# Patient Record
Sex: Female | Born: 1980 | Race: White | Hispanic: No | Marital: Married | Smoking: Never smoker
Health system: Southern US, Community
[De-identification: ages and names within clinical notes are randomized; demographics above are authoritative.]

## PROBLEM LIST (undated history)

## (undated) DIAGNOSIS — E079 Disorder of thyroid, unspecified: Secondary | ICD-10-CM

## (undated) DIAGNOSIS — R768 Other specified abnormal immunological findings in serum: Secondary | ICD-10-CM

## (undated) DIAGNOSIS — R51 Headache: Secondary | ICD-10-CM

## (undated) DIAGNOSIS — F419 Anxiety disorder, unspecified: Secondary | ICD-10-CM

## (undated) HISTORY — DX: Other specified abnormal immunological findings in serum: R76.8

## (undated) HISTORY — PX: WISDOM TOOTH EXTRACTION: SHX21

## (undated) HISTORY — DX: Anxiety disorder, unspecified: F41.9

## (undated) HISTORY — PX: AUGMENTATION MAMMAPLASTY: SUR837

## (undated) HISTORY — PX: OTHER SURGICAL HISTORY: SHX169

## (undated) HISTORY — DX: Disorder of thyroid, unspecified: E07.9

---
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
## 1998-12-10 ENCOUNTER — Other Ambulatory Visit: Admission: RE | Admit: 1998-12-10 | Discharge: 1998-12-10 | Payer: Self-pay | Admitting: Gynecology

## 1999-12-01 ENCOUNTER — Other Ambulatory Visit: Admission: RE | Admit: 1999-12-01 | Discharge: 1999-12-01 | Payer: Self-pay | Admitting: Gynecology

## 2000-12-16 ENCOUNTER — Other Ambulatory Visit: Admission: RE | Admit: 2000-12-16 | Discharge: 2000-12-16 | Payer: Self-pay | Admitting: Gynecology

## 2001-12-20 ENCOUNTER — Other Ambulatory Visit: Admission: RE | Admit: 2001-12-20 | Discharge: 2001-12-20 | Payer: Self-pay | Admitting: Gynecology

## 2002-12-21 ENCOUNTER — Other Ambulatory Visit: Admission: RE | Admit: 2002-12-21 | Discharge: 2002-12-21 | Payer: Self-pay | Admitting: Gynecology

## 2003-07-24 ENCOUNTER — Inpatient Hospital Stay (HOSPITAL_COMMUNITY): Admission: RE | Admit: 2003-07-24 | Discharge: 2003-07-27 | Payer: Self-pay | Admitting: Gynecology

## 2003-09-11 ENCOUNTER — Other Ambulatory Visit: Admission: RE | Admit: 2003-09-11 | Discharge: 2003-09-11 | Payer: Self-pay | Admitting: Gynecology

## 2004-09-25 ENCOUNTER — Other Ambulatory Visit: Admission: RE | Admit: 2004-09-25 | Discharge: 2004-09-25 | Payer: Self-pay | Admitting: Gynecology

## 2005-01-09 HISTORY — PX: OTHER SURGICAL HISTORY: SHX169

## 2005-08-20 ENCOUNTER — Other Ambulatory Visit: Admission: RE | Admit: 2005-08-20 | Discharge: 2005-08-20 | Payer: Self-pay | Admitting: Gynecology

## 2006-03-12 ENCOUNTER — Inpatient Hospital Stay (HOSPITAL_COMMUNITY): Admission: RE | Admit: 2006-03-12 | Discharge: 2006-03-14 | Payer: Self-pay | Admitting: Gynecology

## 2006-04-26 ENCOUNTER — Other Ambulatory Visit: Admission: RE | Admit: 2006-04-26 | Discharge: 2006-04-26 | Payer: Self-pay | Admitting: Gynecology

## 2006-10-10 HISTORY — PX: BREAST SURGERY: SHX581

## 2006-10-10 HISTORY — PX: AUGMENTATION MAMMAPLASTY: SUR837

## 2009-05-11 HISTORY — PX: LASIK: SHX215

## 2010-08-27 ENCOUNTER — Ambulatory Visit: Payer: 59 | Admitting: Obstetrics & Gynecology

## 2010-08-27 DIAGNOSIS — Z30433 Encounter for removal and reinsertion of intrauterine contraceptive device: Secondary | ICD-10-CM

## 2010-08-29 ENCOUNTER — Other Ambulatory Visit: Payer: Self-pay | Admitting: Family

## 2010-08-29 ENCOUNTER — Ambulatory Visit: Payer: 59

## 2010-08-29 DIAGNOSIS — Z01419 Encounter for gynecological examination (general) (routine) without abnormal findings: Secondary | ICD-10-CM

## 2010-08-29 DIAGNOSIS — Z1272 Encounter for screening for malignant neoplasm of vagina: Secondary | ICD-10-CM

## 2010-08-30 NOTE — Assessment & Plan Note (Signed)
Holly Washington, Holly Washington            ACCOUNT NO.:  [id number]  MEDICAL RECORD NO.:  [id number]           PATIENT TYPE:  O  LOCATION:  [facility] [place]          [facility]ACILITY:  [facility]  PHYSICIAN:  Sid Falcon, CNM  DATE OF BIRTH:  August 08, 1980  DATE OF SERVICE:  08/29/2010                                 CLINIC NOTE  REASON FOR VISIT:  Well-woman exam and Pap smear.  The patient is here with reports of last Pap smear in 2010.  MENSTRUAL HISTORY:  Menarche at 30 years of age.  Reports a history of regular cycles.  Currently, has an IUD in place, but does not recall her last menstrual cycle.  OBSTETRICAL HISTORY:  History of 2 pregnancies with 2 living children, both via C-section x2.  GYNECOLOGIC HISTORY:  Last Pap smear in September 2010.  Denies history of abnormal Pap.  PERSONAL MEDICAL HISTORY:  Irritable bowel syndrome, followup with primary care provider and migraines.  SOCIAL HISTORY:  Currently, employed as a Engineer, civil (consulting).  Lives with spouse and children.  The patient denies smoking, illicit drug use or alcohol use. One partner in the past year.  FAMILY HISTORY:  High blood pressure in father, paternal grandmother and paternal grandfather.  Father with history of bladder cancer.  REVIEW OF SYSTEMS:  Headaches and abdominal pain in the past month.  The patient received followup here in our office for the pelvic pain with in an attempt to remove the Mirena, it was unsuccessful.  The patient will be rescheduled for removal of the IUD in the OR exam.  PHYSICAL EXAMINATION:  VITAL SIGNS:  Stable, alert and oriented x3. CARDIOVASCULAR SYSTEM:  Regular rate and rhythm. LUNGS:  Clear to auscultation bilaterally. BREAST EXAM:  Deferred per patient saying exam completed at previous visits. PELVIC:  No abnormal lesions.  No abnormal discharge.  Cervix well visualized.  Pap smear obtained without difficulty.  Negative cervical motion tenderness.  ASSESSMENT:  Well-woman exam.  PLAN:   Pap smear to lab.  The patient will receive follow up in 1 week or sooner as needed.     Sid Falcon, CNM    WM/MEDQ  D:  08/29/2010  T:  08/30/2010  Job:  [id number]

## 2010-09-09 HISTORY — PX: DILATION AND CURETTAGE OF UTERUS: SHX78

## 2010-09-22 ENCOUNTER — Other Ambulatory Visit: Payer: Self-pay | Admitting: Obstetrics & Gynecology

## 2010-09-22 DIAGNOSIS — Z01818 Encounter for other preprocedural examination: Secondary | ICD-10-CM

## 2010-09-22 DIAGNOSIS — Z30431 Encounter for routine checking of intrauterine contraceptive device: Secondary | ICD-10-CM

## 2010-09-24 ENCOUNTER — Other Ambulatory Visit (HOSPITAL_BASED_OUTPATIENT_CLINIC_OR_DEPARTMENT_OTHER): Payer: 59

## 2010-09-24 ENCOUNTER — Ambulatory Visit (HOSPITAL_BASED_OUTPATIENT_CLINIC_OR_DEPARTMENT_OTHER)
Admission: RE | Admit: 2010-09-24 | Discharge: 2010-09-24 | Disposition: A | Discharge status: Home / Self Care | Payer: 59 | Source: Ambulatory Visit | Attending: Obstetrics & Gynecology | Admitting: Obstetrics & Gynecology

## 2010-09-24 ENCOUNTER — Ambulatory Visit (INDEPENDENT_AMBULATORY_CARE_PROVIDER_SITE_OTHER)
Admission: RE | Admit: 2010-09-24 | Discharge: 2010-09-24 | Disposition: A | Discharge status: Home / Self Care | Payer: 59 | Source: Ambulatory Visit | Attending: Obstetrics & Gynecology | Admitting: Obstetrics & Gynecology

## 2010-09-24 DIAGNOSIS — Z30431 Encounter for routine checking of intrauterine contraceptive device: Secondary | ICD-10-CM

## 2010-09-24 DIAGNOSIS — Z975 Presence of (intrauterine) contraceptive device: Secondary | ICD-10-CM | POA: Insufficient documentation

## 2010-10-02 ENCOUNTER — Ambulatory Visit (HOSPITAL_COMMUNITY)
Admission: RE | Admit: 2010-10-02 | Discharge: 2010-10-02 | Disposition: A | Discharge status: Home / Self Care | Payer: 59 | Source: Ambulatory Visit | Attending: Obstetrics & Gynecology | Admitting: Obstetrics & Gynecology

## 2010-10-02 ENCOUNTER — Other Ambulatory Visit: Payer: Self-pay | Admitting: Obstetrics & Gynecology

## 2010-10-02 DIAGNOSIS — Z30432 Encounter for removal of intrauterine contraceptive device: Secondary | ICD-10-CM

## 2010-10-02 DIAGNOSIS — N938 Other specified abnormal uterine and vaginal bleeding: Secondary | ICD-10-CM | POA: Insufficient documentation

## 2010-10-02 DIAGNOSIS — N949 Unspecified condition associated with female genital organs and menstrual cycle: Secondary | ICD-10-CM | POA: Insufficient documentation

## 2010-10-02 LAB — CBC
Hemoglobin: 14.2 g/dL (ref 12.0–15.0)
MCH: 30.7 pg (ref 26.0–34.0)
MCHC: 35.3 g/dL (ref 30.0–36.0)
Platelets: 201 10*3/uL (ref 150–400)
RBC: 4.62 MIL/uL (ref 3.87–5.11)

## 2010-10-02 LAB — PREGNANCY, URINE: Preg Test, Ur: NEGATIVE

## 2010-10-02 NOTE — H&P (Signed)
Holly Washington, Holly Washington            ACCOUNT NO.:  [id number]  MEDICAL RECORD NO.:  [id number]           PATIENT TYPE:  LOCATION:  [facility]                     FACILITY:  PHYSICIAN:  Allie Bossier, MD        DATE OF BIRTH:  1981-03-14  DATE OF SERVICE:  10/01/2010                          PRE-OP HISTORY & PHYSICAL  Ms. Rahe is a 30 year old married white G2, P2 with a history of two C-sections.  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  She has a longstanding history of dysmenorrhea and has had a Marina IUD placed for birth control issues as well for prevention of her dysfunctional uterine bleeding.  However, it is now time for the IUD to be removed, and she wanted it replaced.  I attempted to remove it August 27, 2010, but the strings were not visible, and I was unable to remove it.  After further discussion with her, she preferred permanent sterility in the form of an Essure as well as a NovaSure ablation.  I certainly agree with this.  PAST MEDICAL HISTORY:  Significant for migraines, irritable bowel, and dysmenorrhea.  REVIEW OF SYSTEMS:  She is married for 10 years.  She denies dyspareunia.  The remainder of review of systems questions are negative. Her Pap smear in April 2012 was negative.  PAST SURGERIES:  Two C-sections.  FAMILY HISTORY:  Negative for breast, GYN, or colon malignancies.  SOCIAL HISTORY:  Negative for tobacco, alcohol, or drug use.  ALLERGIES:  No known drug allergies.  Medicine allergies:  CODEINE causes a rash.  MEDICATIONS:  Multivitamin, B12, fish oil, Maxalt, and Topamax.  She is also taking OTC Lo for breakthrough bleeding that she is having with the IUD.  PHYSICAL EXAMINATION:  GENERAL:  Well-nourished and well-hydrated pleasant white female. VITAL SIGNS:  Height 5 feet 7 inches, weight 197 pounds, blood pressure 122/80, pulse 77. HEENT:  Normal. BREASTS:  Normal  bilaterally. HEART:  Regular rate and rhythm. LUNGS:  Clear to auscultation bilaterally. EXTERNAL GENITALIA:  No lesions.  Bimanual exam:  Uterus normal size and shape, anteverted, mobile.  Adnexa nontender.  No masses.  ASSESSMENT AND PLAN: 1. Retained intrauterine contraceptive device.  I will plan to remove     this in the operating room and sample her uterine lining at the     same time in the form of a dilation and curettage. 2. Birth control.  I will do the Essure in the operating room.  I have     counseled that it is recommended by the company that she have a 4-     month HSG as determined the complete obstruction of her tubes.  She     will stay on over-the-counter Lo for at least 4 months. 3. Dysfunctional uterine bleeding and dysmenorrhea.  I will do the     NovaSure ablation.     Allie Bossier, MD    MCD/MEDQ  D:  10/01/2010  T:  10/01/2010  Job:  [id number]

## 2010-10-20 NOTE — Op Note (Signed)
Holly Washington, Holly Washington            ACCOUNT NO.:  [id number]  MEDICAL RECORD NO.:  [id number]           PATIENT TYPE:  O  LOCATION:  [facility]                          [facility]  PHYSICIAN:  Allie Bossier, MD        DATE OF BIRTH:  [birth year]  DATE OF PROCEDURE:  10/02/2010 DATE OF DISCHARGE:                              OPERATIVE REPORT   PREOPERATIVE DIAGNOSES: 1. Retained intrauterine device. 2. Dysfunctional uterine bleeding. 3. Desires sterility.  POSTOPERATIVE DIAGNOSES: 1. Retained intrauterine device. 2. Dysfunctional uterine bleeding. 3. Desires sterility.  PROCEDURE:  Removal of IUD, D and C, attempted Essure, attempted NovaSure.  SURGEON:  Shekira Drummer C. Marice Potter, MD  ANESTHESIA:  GETA, Brayton Caves, MD  COMPLICATIONS:  None.  ESTIMATED BLOOD LOSS:  Minimal.  SPECIMENS:  Uterine curettings.  FINDINGS:  Very shaggy endometrium.  The IUD was stuck in the wall of the uterus.  DETAILED PROCEDURE AND FINDINGS:  Risks, benefits, alternatives of surgery were explained, understood, and accepted.  Consents were signed. In the operating room, general anesthesia was done without complication. She was placed in dorsal lithotomy position.  Her vagina and lower abdomen were prepped and draped in usual sterile fashion.  Bladder was emptied with a Robinson catheter.  Bimanual exam revealed upper limits of normal mid plane mobile uterus and nonenlarged adnexa.  A vaginal speculum was placed.  The anterior lip of the cervix was grasped with single-tooth tenaculum.  The uterus sounded to 11 cm.  The endocervical canal measured 6 cm and the uterine cavity length 5 cm.  I reached in with polyp forceps and was unable to grasp the IUD.  Please note the ultrasound showed the possibility of the IUD being better than the endometrium multiple with several attempts with the polyp forceps yielded no IUD.  It took using a serrated curette, as well I was able to get the IUD out with moderate  amount of bleeding.  I placed the hysteroscope, to try to do the Essure, but the endometrium was very shaggy and there was a moderate amount of bleeding from the IUD removal site.  I was able to visualize the ostia on the patient's right, but I was unable to visualize the ostia on the left due to shagginess of the endometrium as well as bleeding, I therefore decided that I would go ahead and do the NovaSure ablation and then reattempt the Essure, however, despite two different NovaSure apparatuses and two different power sources for the NovaSure, the NovaSure still did not pass the test before it would proceed with the procedure.  We repeated the hysteroscopy, I did not see any perforations, but the endometrium was relatively bloody from the repeated manipulations of the endometrium and I was unable to visualize either of the ostia at this time and I felt that no matter how much manipulation I did of the endometrium, I was not going to be able to get either one of these procedures to be successfully done.  I therefore removed the tenaculum and placed pressure on the tenaculum site.  Please note, I did do a paracervical block and injected 10 mL  of 0.5% Marcaine paracervically at the beginning of the case.  She was extubated and tolerated the procedure well.  The instrument, sponge, and needle counts were correct.     Allie Bossier, MD     MCD/MEDQ  D:  10/02/2010  T:  10/03/2010  Job:  [id number]  Electronically Signed by Nicholaus Bloom MD on 10/20/2010 09:45:50 AM

## 2010-11-20 NOTE — Assessment & Plan Note (Signed)
NAMEPHILLIPPA, STRAUB            ACCOUNT NO.:  [id number]  MEDICAL RECORD NO.:  [id number]           PATIENT TYPE:  O  LOCATION:  [facility] [place]          FACILITY:  [facility]  PHYSICIAN:  Allie Bossier, MD        DATE OF BIRTH:  27-Feb-1981  DATE OF SERVICE:  10/01/2010                                 CLINIC NOTE  Ms. 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  She complains of   Dictation ended at this point.     Allie Bossier, MD    MCD/MEDQ  D:  10/01/2010  T:  10/01/2010  Job:  [id number]

## 2010-11-21 ENCOUNTER — Other Ambulatory Visit (HOSPITAL_COMMUNITY): Payer: 59

## 2010-11-25 ENCOUNTER — Encounter (HOSPITAL_COMMUNITY): Payer: Self-pay | Admitting: *Deleted

## 2010-11-26 ENCOUNTER — Encounter: Payer: Self-pay | Admitting: *Deleted

## 2010-11-26 ENCOUNTER — Encounter: Payer: Self-pay | Admitting: Obstetrics & Gynecology

## 2010-11-26 NOTE — Progress Notes (Unsigned)
  Subjective:  History and Physical    Patient ID: Holly Washington, female    DOB: 16-Jan-1981, 30 y.o.   MRN: [id number]  HPI Holly Washington is a 30 year old married white gravida 2 para 2 with a history of 2 cesarean sections she has a long-standing history of dysmenorrhea and has had a Mirena IUD placed for birth control issues as well as prevention of her dysfunctional uterine bleeding. At the time when it was supposed to be removed and replaced, I was unable to do so. At that time the strings were not visible. We had a long discussion about her birth control methods and at that time she chose to have permanent sterility in the form of an Essure as well the NovaSure ablation. In the operating room I was able to remove her IUD but due to so much bleeding from the endometrium caused by the removal of the IUD, I was unable to place the Essure device. At this point the cervix had been dilated too much or occult perforation had occurred, because of the NovaSure device would never passed the test. She is now coming in the hospital for a tubal ligation via laparoscopy to be followed by the NovaSure endometrial ablation. I am going to give her Cytotec by mouth tonight to assist with dilation of her cervix tomorrow.   Past medical history: Migraines, irritable bowel disorder, and dysmenorrhea, menorrhagia.    Review of Systems she has been married for the last 10 years. She denies dyspareunia. The remainder of her review of systems questions or negative. Her Pap smear was done in April 2012 and it was normal.  Past surgical history:  2 cesarean sections and attempted NovaSure and Essure  Medications: Multivitamin, B12, visual, Maxalt when necessary, Topamax   Allergies: Codeine causes a rash  Social history: Negative for tobacco, alcohol, or drug use  She denies a latex allergy      Objective:   Physical Exam    HEENT: Normal Heart: Regular rate and rhythm without murmur rubs or  gallops Lungs: clear to auscultation bilaterally Breasts: Normal exam bilaterally Abdomen: Normal Pelvic exam: Her uterus is normal size and shape, anteverted, mobile . Adnexa: Nontender no masses    Assessment & Plan:  #1 She desires sterility in the form of a tubal ligation. All risks have been explained and understood and accepted. She is aware that there is a failure rate of approximately 1% after this procedure.  #2 dysfunctional uterine bleeding: I will do a NovaSure endometrial ablation.

## 2010-11-27 ENCOUNTER — Encounter (HOSPITAL_COMMUNITY): Payer: Self-pay | Admitting: Certified Registered Nurse Anesthetist

## 2010-11-27 ENCOUNTER — Ambulatory Visit (HOSPITAL_COMMUNITY): Payer: 59 | Admitting: Certified Registered Nurse Anesthetist

## 2010-11-27 ENCOUNTER — Encounter (HOSPITAL_COMMUNITY)
Admission: RE | Disposition: A | Discharge status: Home / Self Care | Payer: Self-pay | Source: Ambulatory Visit | Attending: Obstetrics & Gynecology

## 2010-11-27 ENCOUNTER — Ambulatory Visit (HOSPITAL_COMMUNITY)
Admission: RE | Admit: 2010-11-27 | Discharge: 2010-11-27 | Disposition: A | Discharge status: Home / Self Care | Payer: 59 | Source: Ambulatory Visit | Attending: Obstetrics & Gynecology | Admitting: Obstetrics & Gynecology

## 2010-11-27 ENCOUNTER — Other Ambulatory Visit (HOSPITAL_COMMUNITY): Payer: 59

## 2010-11-27 DIAGNOSIS — N938 Other specified abnormal uterine and vaginal bleeding: Secondary | ICD-10-CM | POA: Insufficient documentation

## 2010-11-27 DIAGNOSIS — N803 Endometriosis of pelvic peritoneum, unspecified: Secondary | ICD-10-CM | POA: Insufficient documentation

## 2010-11-27 DIAGNOSIS — N949 Unspecified condition associated with female genital organs and menstrual cycle: Secondary | ICD-10-CM | POA: Insufficient documentation

## 2010-11-27 DIAGNOSIS — Z302 Encounter for sterilization: Secondary | ICD-10-CM | POA: Insufficient documentation

## 2010-11-27 DIAGNOSIS — Z9889 Other specified postprocedural states: Secondary | ICD-10-CM

## 2010-11-27 HISTORY — PX: LAPAROSCOPIC TUBAL LIGATION: SHX1937

## 2010-11-27 HISTORY — DX: Headache: R51

## 2010-11-27 HISTORY — PX: NOVASURE ABLATION: SHX5394

## 2010-11-27 LAB — CBC
HCT: 39.2 % (ref 36.0–46.0)
Hemoglobin: 14 g/dL (ref 12.0–15.0)
MCHC: 35.7 g/dL (ref 30.0–36.0)
MCV: 85.8 fL (ref 78.0–100.0)
RDW: 12.2 % (ref 11.5–15.5)
WBC: 6.7 10*3/uL (ref 4.0–10.5)

## 2010-11-27 LAB — SURGICAL PCR SCREEN
MRSA, PCR: NEGATIVE
Staphylococcus aureus: POSITIVE — AB

## 2010-11-27 SURGERY — LIGATION, FALLOPIAN TUBE, LAPAROSCOPIC
Anesthesia: General | Site: Uterus | Wound class: Clean

## 2010-11-27 MED ORDER — KETOROLAC TROMETHAMINE 30 MG/ML IJ SOLN
INTRAMUSCULAR | Status: DC | PRN
  Administered 2010-11-27: 30 mg via INTRAVENOUS

## 2010-11-27 MED ORDER — MIDAZOLAM HCL 5 MG/5ML IJ SOLN
INTRAMUSCULAR | Status: DC | PRN
  Administered 2010-11-27: 2 mg via INTRAVENOUS

## 2010-11-27 MED ORDER — DEXAMETHASONE SODIUM PHOSPHATE 10 MG/ML IJ SOLN
INTRAMUSCULAR | Status: DC | PRN
  Administered 2010-11-27: 10 mg via INTRAVENOUS

## 2010-11-27 MED ORDER — PROMETHAZINE HCL 25 MG/ML IJ SOLN: 6.2500 mg | INTRAMUSCULAR | Status: DC | PRN

## 2010-11-27 MED ORDER — ROCURONIUM BROMIDE 100 MG/10ML IV SOLN
INTRAVENOUS | Status: DC | PRN
  Administered 2010-11-27: 30 mg via INTRAVENOUS

## 2010-11-27 MED ORDER — ACETAMINOPHEN 10 MG/ML IV SOLN
1000.0000 mg | Freq: Once | INTRAVENOUS | Status: DC | PRN
  Filled 2010-11-27: qty 100

## 2010-11-27 MED ORDER — ONDANSETRON HCL 4 MG/2ML IJ SOLN
INTRAMUSCULAR | Status: AC
  Filled 2010-11-27: qty 2

## 2010-11-27 MED ORDER — ACETAMINOPHEN 325 MG PO TABS: 325.0000 mg | ORAL_TABLET | ORAL | Status: DC | PRN

## 2010-11-27 MED ORDER — PROPOFOL 10 MG/ML IV EMUL
INTRAVENOUS | Status: DC | PRN
  Administered 2010-11-27: 200 mg via INTRAVENOUS

## 2010-11-27 MED ORDER — KETOROLAC TROMETHAMINE 30 MG/ML IJ SOLN
INTRAMUSCULAR | Status: AC
  Filled 2010-11-27: qty 1

## 2010-11-27 MED ORDER — ONDANSETRON HCL 4 MG/2ML IJ SOLN
INTRAMUSCULAR | Status: DC | PRN
  Administered 2010-11-27: 4 mg via INTRAVENOUS

## 2010-11-27 MED ORDER — MIDAZOLAM HCL 2 MG/2ML IJ SOLN
INTRAMUSCULAR | Status: AC
  Filled 2010-11-27: qty 2

## 2010-11-27 MED ORDER — LACTATED RINGERS IV SOLN
INTRAVENOUS | Status: DC
  Administered 2010-11-27 (×2): via INTRAVENOUS

## 2010-11-27 MED ORDER — BUPIVACAINE HCL (PF) 0.25 % IJ SOLN
INTRAMUSCULAR | Status: DC | PRN
  Administered 2010-11-27: 20 mL

## 2010-11-27 MED ORDER — GLYCOPYRROLATE 0.2 MG/ML IJ SOLN
INTRAMUSCULAR | Status: DC | PRN
  Administered 2010-11-27: .6 mg via INTRAVENOUS

## 2010-11-27 MED ORDER — NEOSTIGMINE METHYLSULFATE 1 MG/ML IJ SOLN
INTRAMUSCULAR | Status: AC
  Filled 2010-11-27: qty 10

## 2010-11-27 MED ORDER — FENTANYL CITRATE 0.05 MG/ML IJ SOLN
INTRAMUSCULAR | Status: DC | PRN
  Administered 2010-11-27: 100 ug via INTRAVENOUS
  Administered 2010-11-27: 150 ug via INTRAVENOUS

## 2010-11-27 MED ORDER — ROCURONIUM BROMIDE 50 MG/5ML IV SOLN
INTRAVENOUS | Status: AC
  Filled 2010-11-27: qty 1

## 2010-11-27 MED ORDER — DEXAMETHASONE SODIUM PHOSPHATE 10 MG/ML IJ SOLN
INTRAMUSCULAR | Status: AC
  Filled 2010-11-27: qty 1

## 2010-11-27 MED ORDER — HYDROMORPHONE HCL 1 MG/ML IJ SOLN
0.2500 mg | INTRAMUSCULAR | Status: DC | PRN
  Administered 2010-11-27 (×2): 0.5 mg via INTRAVENOUS

## 2010-11-27 MED ORDER — NEOSTIGMINE METHYLSULFATE 1 MG/ML IJ SOLN
INTRAMUSCULAR | Status: DC | PRN
  Administered 2010-11-27: 3 mg via INTRAMUSCULAR

## 2010-11-27 MED ORDER — MEPERIDINE HCL 25 MG/ML IJ SOLN: 6.2500 mg | INTRAMUSCULAR | Status: DC | PRN

## 2010-11-27 MED ORDER — LIDOCAINE HCL (CARDIAC) 20 MG/ML IV SOLN
INTRAVENOUS | Status: DC | PRN
  Administered 2010-11-27: 60 mg via INTRAVENOUS

## 2010-11-27 MED ORDER — GLYCOPYRROLATE 0.2 MG/ML IJ SOLN
INTRAMUSCULAR | Status: AC
  Filled 2010-11-27: qty 2

## 2010-11-27 MED ORDER — LIDOCAINE HCL (CARDIAC) 20 MG/ML IV SOLN
INTRAVENOUS | Status: AC
  Filled 2010-11-27: qty 5

## 2010-11-27 MED ORDER — PROPOFOL 10 MG/ML IV EMUL
INTRAVENOUS | Status: AC
  Filled 2010-11-27: qty 20

## 2010-11-27 MED ORDER — HYDROMORPHONE HCL 1 MG/ML IJ SOLN
INTRAMUSCULAR | Status: AC
  Administered 2010-11-27: 0.5 mg via INTRAVENOUS
  Filled 2010-11-27: qty 1

## 2010-11-27 MED ORDER — FENTANYL CITRATE 0.05 MG/ML IJ SOLN
INTRAMUSCULAR | Status: AC
Start: 2010-11-27 — End: 2010-11-27
  Filled 2010-11-27: qty 5

## 2010-11-27 SURGICAL SUPPLY — 26 items
ABLATOR ENDOMETRIAL BIPOLAR (ABLATOR) ×3 IMPLANT
CABLE HIGH FREQUENCY MONO STRZ (ELECTRODE) IMPLANT
CATH ROBINSON RED A/P 16FR (CATHETERS) ×3 IMPLANT
CHLORAPREP W/TINT 26ML (MISCELLANEOUS) ×3 IMPLANT
CLIP FILSHIE TUBAL LIGA STRL (Clip) ×3 IMPLANT
CLOTH BEACON ORANGE TIMEOUT ST (SAFETY) ×3 IMPLANT
DRESSING TELFA 8X3 (GAUZE/BANDAGES/DRESSINGS) ×3 IMPLANT
ELECT REM PT RETURN 9FT ADLT (ELECTROSURGICAL)
ELECTRODE REM PT RTRN 9FT ADLT (ELECTROSURGICAL) IMPLANT
GLOVE BIO SURGEON STRL SZ 6.5 (GLOVE) ×6 IMPLANT
GOWN BRE IMP SLV AUR LG STRL (GOWN DISPOSABLE) ×6 IMPLANT
NDL INSUFFLATION 14GA 120MM (NEEDLE) ×2 IMPLANT
NDL SAFETY ECLIPSE 18X1.5 (NEEDLE) ×2 IMPLANT
NDL SPNL 22GX3.5 QUINCKE BK (NEEDLE) ×2 IMPLANT
NEEDLE HYPO 18GX1.5 SHARP (NEEDLE) ×3
NEEDLE INSUFFLATION 14GA 120MM (NEEDLE) ×3 IMPLANT
NEEDLE SPNL 22GX3.5 QUINCKE BK (NEEDLE) ×3 IMPLANT
PACK LAPAROSCOPY BASIN (CUSTOM PROCEDURE TRAY) ×3 IMPLANT
PACK VAGINAL MINOR WOMEN LF (CUSTOM PROCEDURE TRAY) ×2 IMPLANT
SUT VICRYL 0 UR6 27IN ABS (SUTURE) IMPLANT
SUT VICRYL 4-0 PS2 18IN ABS (SUTURE) ×3 IMPLANT
SYR CONTROL 10ML LL (SYRINGE) ×2 IMPLANT
TOWEL OR 17X24 6PK STRL BLUE (TOWEL DISPOSABLE) ×6 IMPLANT
TROCAR XCEL NON-BLD 11X100MML (ENDOMECHANICALS) ×1 IMPLANT
TROCAR XCEL NON-BLD 5MMX100MML (ENDOMECHANICALS) IMPLANT
WATER STERILE IRR 1000ML POUR (IV SOLUTION) ×3 IMPLANT

## 2010-11-27 NOTE — Transfer of Care (Signed)
Immediate Anesthesia Transfer of Care Note  Patient: Holly Washington  Procedure(s) Performed:  LAPAROSCOPIC TUBAL LIGATION; NOVASURE ABLATION  Patient Location: PACU  Anesthesia Type: General  Level of Consciousness: awake, alert  and oriented  Airway & Oxygen Therapy: Patient Spontanous Breathing and Patient connected to nasal cannula oxygen  Post-op Assessment: Report given to PACU RN, Post -op Vital signs reviewed and stable and Patient moving all extremities X 4  Post vital signs: Reviewed and stable  Complications: No apparent anesthesia complications

## 2010-11-27 NOTE — Anesthesia Postprocedure Evaluation (Signed)
  Anesthesia Post-op Note  Patient: Holly Washington  Procedure(s) Performed:  LAPAROSCOPIC TUBAL LIGATION; NOVASURE ABLATION  Patient is awake and responsive. Pain and nausea are reasonably well controlled. Vital signs are stable and clinically acceptable. Oxygen saturation is clinically acceptable. There are no apparent anesthetic complications at this time. Patient is ready for discharge.

## 2010-11-27 NOTE — Anesthesia Preprocedure Evaluation (Addendum)
Anesthesia Evaluation  Name, MR# and DOB Patient awake  General Assessment Comment  Reviewed: Allergy & Precautions, H&P , Patient's Chart, lab work & pertinent test results and reviewed documented beta blocker date and time   History of Anesthesia Complications Negative for: history of anesthetic complications  Airway Mallampati: II TM Distance: >3 FB Neck ROM: full    Dental No notable dental hx    Pulmonaryneg pulmonary ROS    clear to auscultation  pulmonary exam normal   Cardiovascular Exercise Tolerance: Good regular Normal   Neuro/PsychNegative Neurological ROS Negative Psych ROS  GI/Hepatic/Renal negative GI ROS, negative Liver ROS, and negative Renal ROS (+)       Endo/Other  Negative Endocrine ROS (+)   Abdominal   Musculoskeletal  Hematology negative hematology ROS (+)   Peds  Reproductive/Obstetrics negative OB ROS   Anesthesia Other Findings             Anesthesia Physical Anesthesia Plan  ASA: II  Anesthesia Plan: General   Post-op Pain Management:    Induction:   Airway Management Planned:   Additional Equipment:   Intra-op Plan:   Post-operative Plan:   Informed Consent: I have reviewed the patients History and Physical, chart, labs and discussed the procedure including the risks, benefits and alternatives for the proposed anesthesia with the patient or authorized representative who has indicated his/her understanding and acceptance.   Dental Advisory Given  Plan Discussed with: CRNA and Surgeon  Anesthesia Plan Comments:        Anesthesia Quick Evaluation  

## 2010-11-27 NOTE — Op Note (Signed)
Preoperative diagnosis: Desires sterility and dysfunctional uterine bleeding Postop diagnosis: Same plus adhesions and endometriosis Surgeon: Kalman Drape M.D. Anesthesia: General Procedure: Laparoscopic sterilization procedure with Filshie clips and NovaSure endometrial ablation Complications: None Estimated blood loss: Minimal Specimens: None Detailed procedure of findings: The risks, benefits, alternatives, and failure rate of both procedures, were explained, understood, and accepted. Consents were signed and all questions were answered. She told me that she has her postop pain medicines at home already. She was taken to the operating room and general anesthesia was applied without complication. Her abdomen and vagina were prepped and draped in the usual sterile fashion. She was in the dorsal lithotomy position. A bimanual exam revealed a normal size and shape anteverted, mobile uterus and nonenlarged adnexa. Her bladder was drained with a Robinson catheter for approximately 50 mL of urine. A Hulka manipulator was placed on her cervix and gloves were changed. Attention was turned to the abdomen. 10 mL of 0.5% Marcaine was injected into the umbilical area. A vertical incision was made here. A varies needle was placed intraperitoneally. Patient abdominal pressure was always less than 15. Low-flow CO2 was used to insufflate the abdomen to approximately 4 L. A 10 mm trocar was placed. Laparoscopy confirmed correct placement. The patient was placed in Trendelenburg position. The pelvis was inspected and an alpha manner. There were flimsy omental adhesions noted near the umbilicus. The trocar of weighted these. Both ovaries and ovarian fossa appeared normal as did the posterior and anterior cul-de-sacs. The right portion of the fundus of the uterus headache in tension that resembled endometriosis. Both tubes appeared normal as did the ovaries. A Filshie clip was placed in the isthmic region of each oviduct and no  bleeding was noted from either site. The CO2 was allowed to escape from the abdomen and the subcutaneous tissue was closed with a 4-0 Vicryl suture. Please note that I used an Excell trocar for the 10 mm port. At this point I moved to proceed with the NovaSure ablation. The uterus was sounded to 10 cm. An her endocervical length was measured at 5 cm. Her cervix was dilated with an 8 Pratt dilator, in order to accommodate the NovaSure device. The NovaSure device was placed and the cavity width measured at 2.6 cm the NovaSure device passed its test and then the procedure was run for 1 minute and 39 seconds. At the end of the case I removed the device and it was intact. Please note that I did give her a paracervical block with 10 mL of half percent Marcaine prior to beginning the dilation portion of the case. At the end of the case she was extubated, and taken to the recovery room in stable condition. The instrument sponge and needle counts were correct.

## 2010-12-02 ENCOUNTER — Encounter: Payer: Self-pay | Admitting: *Deleted

## 2010-12-08 ENCOUNTER — Other Ambulatory Visit: Payer: Self-pay | Admitting: Advanced Practice Midwife

## 2010-12-08 MED ORDER — CIPROFLOXACIN-DEXAMETHASONE 0.3-0.1 % OT SUSP: 4.0000 [drp] | Freq: Two times a day (BID) | OTIC | Status: AC

## 2010-12-17 ENCOUNTER — Encounter (HOSPITAL_COMMUNITY): Payer: Self-pay | Admitting: Obstetrics & Gynecology

## 2010-12-29 ENCOUNTER — Other Ambulatory Visit: Payer: Self-pay | Admitting: Obstetrics & Gynecology

## 2010-12-31 ENCOUNTER — Encounter: Payer: Self-pay | Admitting: *Deleted

## 2010-12-31 ENCOUNTER — Other Ambulatory Visit: Payer: Self-pay | Admitting: *Deleted

## 2010-12-31 DIAGNOSIS — Z8669 Personal history of other diseases of the nervous system and sense organs: Secondary | ICD-10-CM

## 2010-12-31 MED ORDER — TOPIRAMATE 100 MG PO TABS: 100.0000 mg | ORAL_TABLET | Freq: Every day | ORAL | Status: DC

## 2011-03-31 ENCOUNTER — Other Ambulatory Visit: Payer: Self-pay | Admitting: Obstetrics & Gynecology

## 2011-04-06 ENCOUNTER — Other Ambulatory Visit: Payer: Self-pay | Admitting: *Deleted

## 2011-04-06 DIAGNOSIS — Z8669 Personal history of other diseases of the nervous system and sense organs: Secondary | ICD-10-CM

## 2011-04-06 MED ORDER — TOPIRAMATE 100 MG PO TABS: 100.0000 mg | ORAL_TABLET | Freq: Every day | ORAL | Status: DC

## 2011-04-07 ENCOUNTER — Other Ambulatory Visit: Payer: Self-pay | Admitting: Obstetrics & Gynecology

## 2011-04-08 ENCOUNTER — Other Ambulatory Visit: Payer: Self-pay | Admitting: *Deleted

## 2011-04-08 DIAGNOSIS — Z8669 Personal history of other diseases of the nervous system and sense organs: Secondary | ICD-10-CM

## 2011-04-08 MED ORDER — TOPIRAMATE 100 MG PO TABS: 100.0000 mg | ORAL_TABLET | Freq: Every day | ORAL | Status: DC

## 2011-06-02 ENCOUNTER — Other Ambulatory Visit: Payer: Self-pay | Admitting: *Deleted

## 2011-06-02 DIAGNOSIS — Z8669 Personal history of other diseases of the nervous system and sense organs: Secondary | ICD-10-CM

## 2011-06-02 MED ORDER — RIZATRIPTAN BENZOATE 10 MG PO TABS: 10.0000 mg | ORAL_TABLET | Freq: Once | ORAL | Status: DC | PRN

## 2011-06-02 MED ORDER — TOPIRAMATE 100 MG PO TABS: 100.0000 mg | ORAL_TABLET | Freq: Every day | ORAL | Status: DC

## 2011-07-15 ENCOUNTER — Telehealth: Payer: Self-pay | Admitting: Obstetrics & Gynecology

## 2011-07-15 MED ORDER — AZITHROMYCIN 250 MG PO TABS: ORAL_TABLET | ORAL | Status: AC

## 2011-07-15 NOTE — Telephone Encounter (Signed)
Complains of a sinus infection and would like a Zpack.

## 2011-12-10 IMAGING — US US PELVIS COMPLETE
1 series · 14 of 25 positions shown · non-contrast
Comparison: None.

CLINICAL DATA: Check IUD placement



[Series 1: us pelvis complete · 0.30mm/px · 14 of 62 slices shown]
[im 1/62]
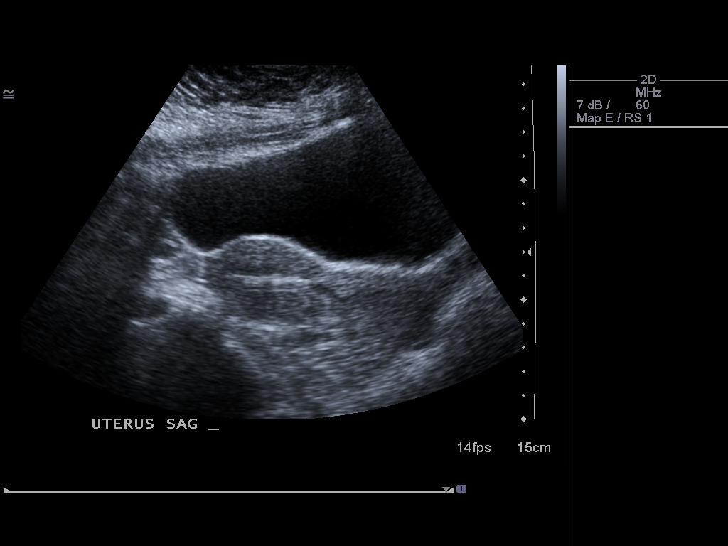
[im 6/62]
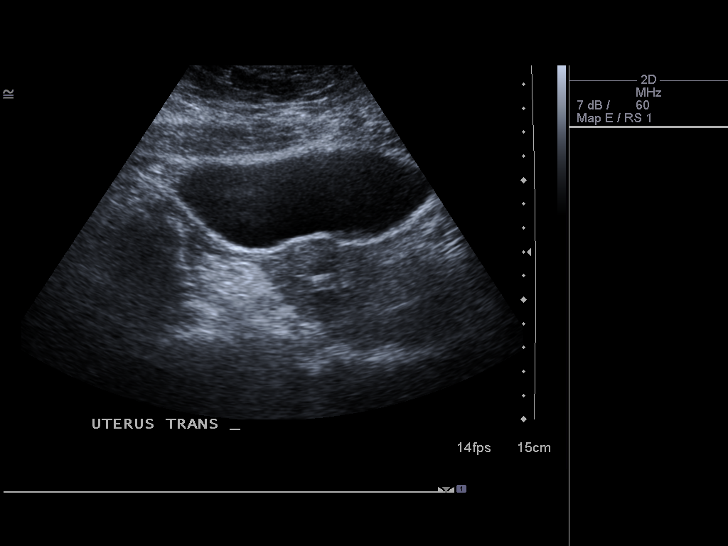
[im 11/62]
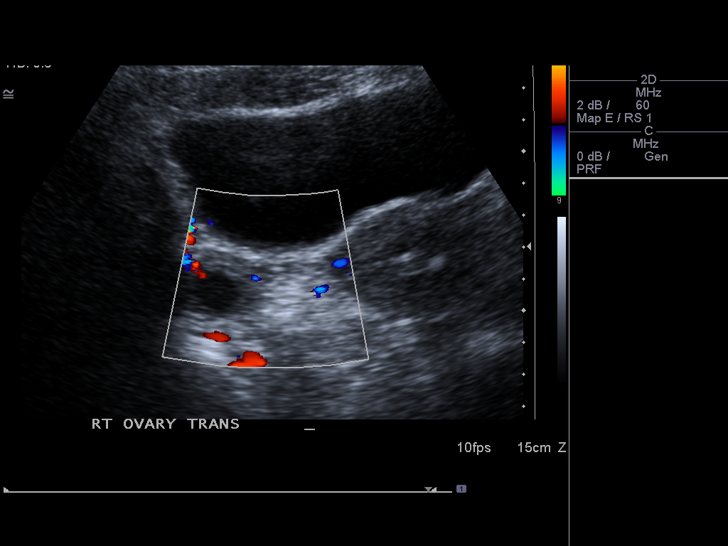
[im 16/62]
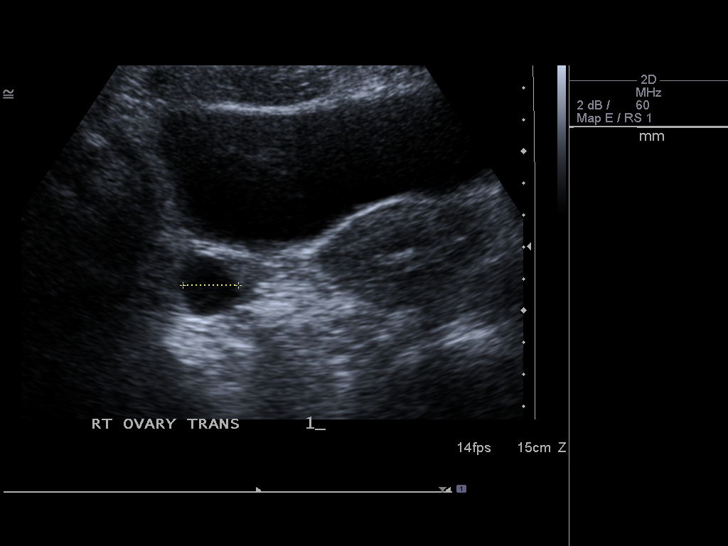
[im 21/62]
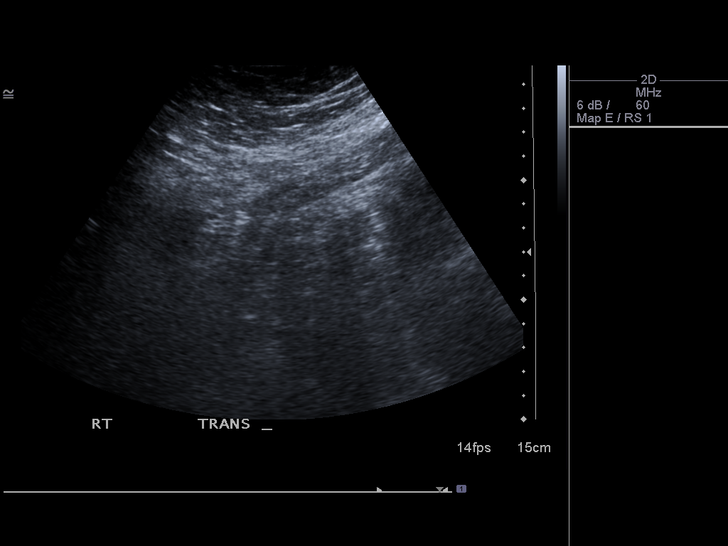
[im 23/62]
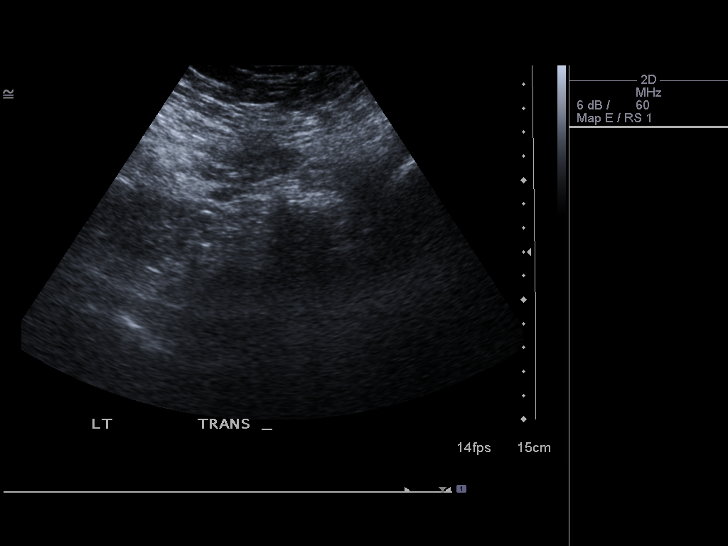
[im 28/62]
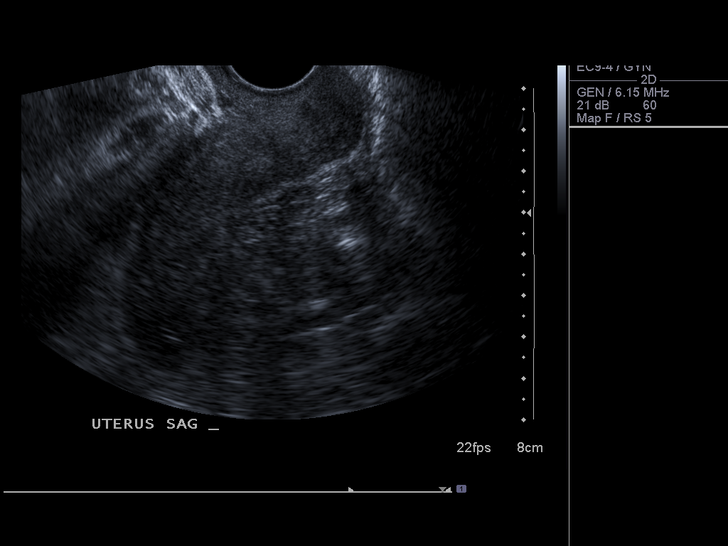
[im 34/62]
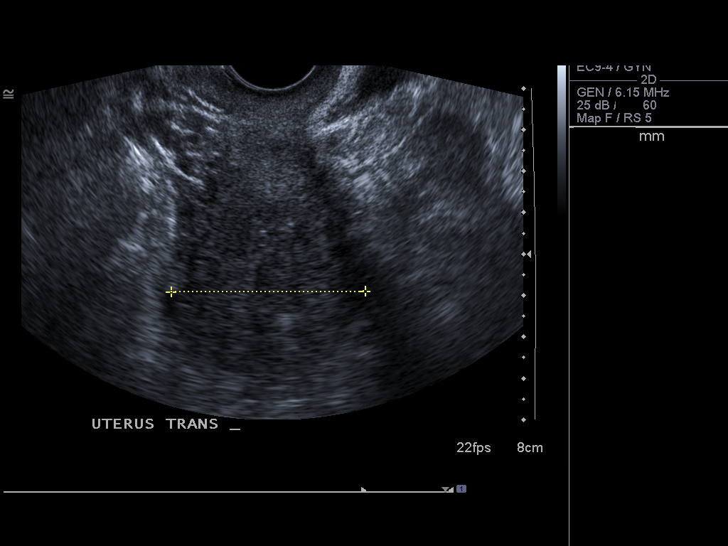
[im 39/62]
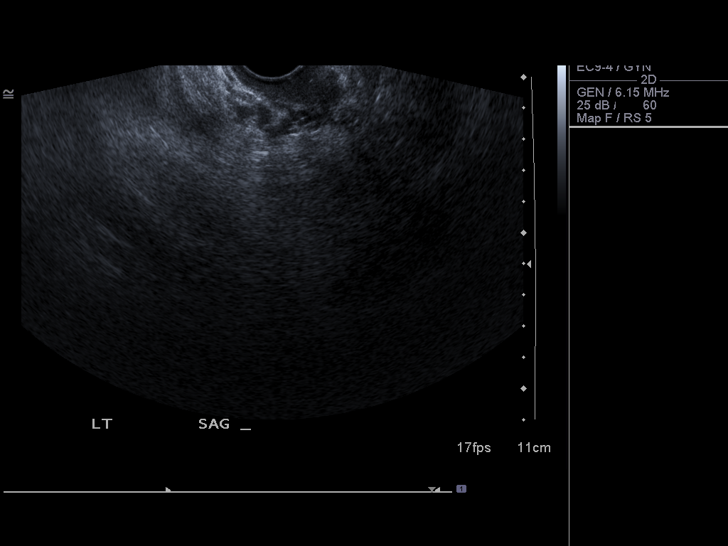
[im 41/62]
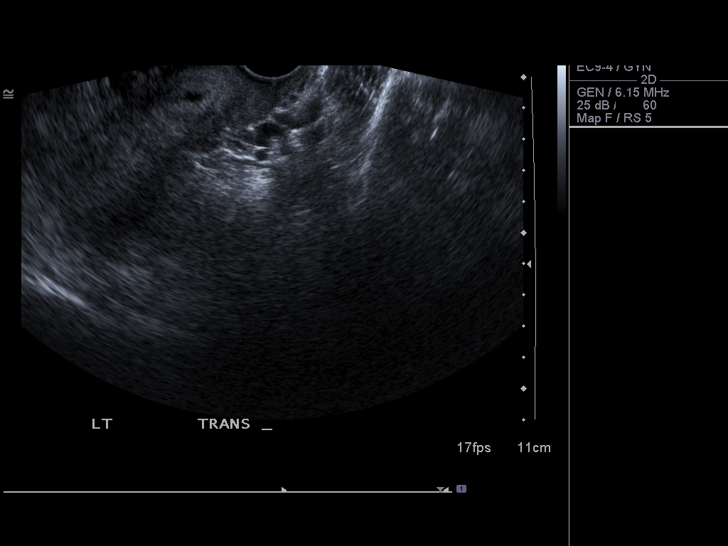
[im 46/62]
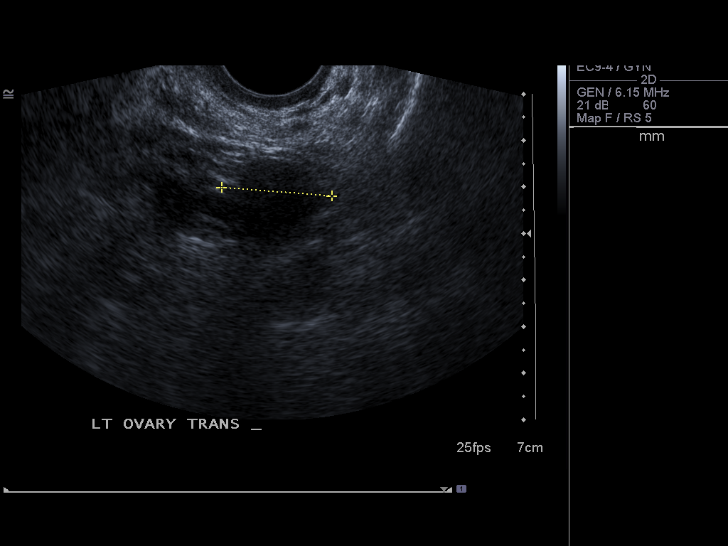
[im 51/62]
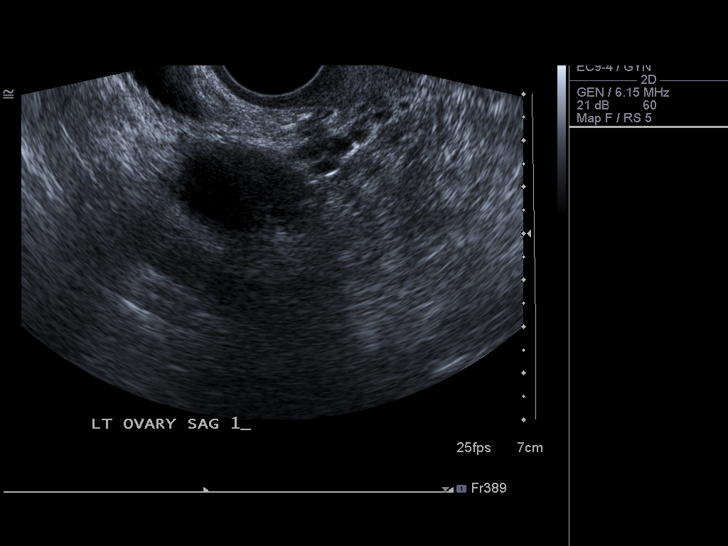
[im 56/62]
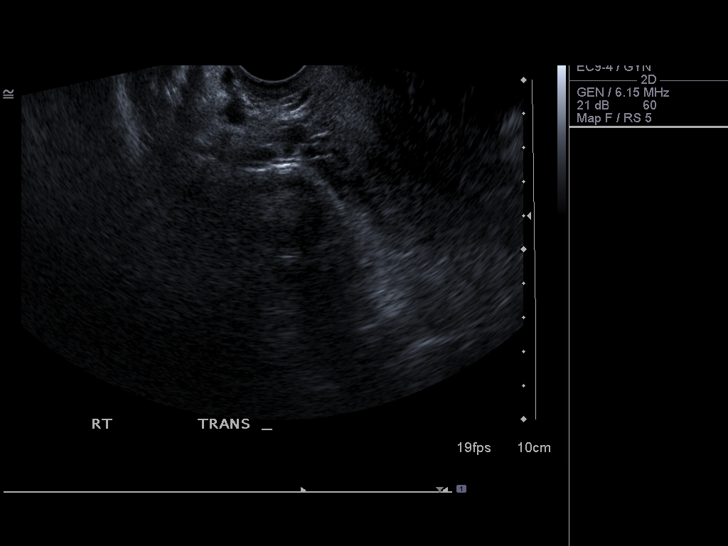
[im 62/62]
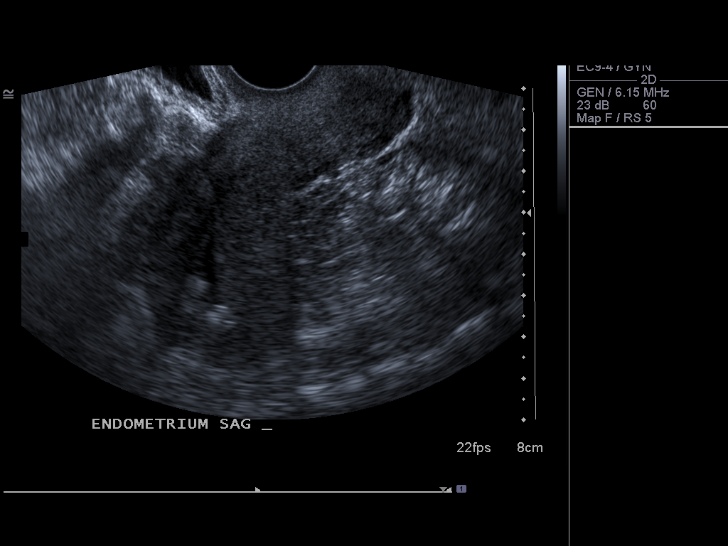

[14 of 25 positions shown; findings below may reference images not displayed]

FINDINGS: Uterus:  8.8 x 3.8 x 4.7 cm.  No evidence for fibroids.

Endometrium:  IUD is identified towards the uterine fundus.  Is not
well seen on the sagittal imaging but appears appropriately
oriented on the coronal images of the uterus.  Some penetration
into the myometrium cannot be excluded by ultrasound. There is some
fluid noted in the endometrial cavity.

Right ovary:  320 x 2-1 x 2.8 cm.  Normal appearance/no adnexal
mass.

Left ovary:  3.2 x 2.2 x 2.4 cm.  Normal appearance/no adnexal
mass.

Other findings:  No free fluid.
IMPRESSION: Normal study.  No evidence of pelvic mass or other significant
abnormality.

## 2012-02-24 ENCOUNTER — Ambulatory Visit (INDEPENDENT_AMBULATORY_CARE_PROVIDER_SITE_OTHER): Payer: 59 | Admitting: Obstetrics & Gynecology

## 2012-02-24 ENCOUNTER — Encounter: Payer: Self-pay | Admitting: Obstetrics & Gynecology

## 2012-02-24 VITALS — BP 113/66 | HR 88 | Temp 96.9°F | Resp 16 | Ht 67.0 in | Wt 190.0 lb

## 2012-02-24 DIAGNOSIS — R10813 Right lower quadrant abdominal tenderness: Secondary | ICD-10-CM

## 2012-02-24 DIAGNOSIS — N949 Unspecified condition associated with female genital organs and menstrual cycle: Secondary | ICD-10-CM

## 2012-02-24 DIAGNOSIS — R102 Pelvic and perineal pain unspecified side: Secondary | ICD-10-CM | POA: Insufficient documentation

## 2012-02-24 NOTE — Patient Instructions (Signed)
Ovarian Cyst The ovaries are small organs that are on each side of the uterus. The ovaries are the organs that produce the female hormones, estrogen and progesterone. An ovarian cyst is a sac filled with fluid that can vary in its size. It is normal for a small cyst to form in women who are in the childbearing age and who have menstrual periods. This type of cyst is called a follicle cyst that becomes an ovulation cyst (corpus luteum cyst) after it produces the women's egg. It later goes away on its own if the woman does not become pregnant. There are other kinds of ovarian cysts that may cause problems and may need to be treated. The most serious problem is a cyst with cancer. It should be noted that menopausal women who have an ovarian cyst are at a higher risk of it being a cancer cyst. They should be evaluated very quickly, thoroughly and followed closely. This is especially true in menopausal women because of the high rate of ovarian cancer in women in menopause. CAUSES AND TYPES OF OVARIAN CYSTS:  FUNCTIONAL CYST: The follicle/corpus luteum cyst is a functional cyst that occurs every month during ovulation with the menstrual cycle. They go away with the next menstrual cycle if the woman does not get pregnant. Usually, there are no symptoms with a functional cyst.  ENDOMETRIOMA CYST: This cyst develops from the lining of the uterus tissue. This cyst gets in or on the ovary. It grows every month from the bleeding during the menstrual period. It is also called a "chocolate cyst" because it becomes filled with blood that turns brown. This cyst can cause pain in the lower abdomen during intercourse and with your menstrual period.  CYSTADENOMA CYST: This cyst develops from the cells on the outside of the ovary. They usually are not cancerous. They can get very big and cause lower abdomen pain and pain with intercourse. This type of cyst can twist on itself, cut off its blood supply and cause severe pain. It  also can easily rupture and cause a lot of pain.  DERMOID CYST: This type of cyst is sometimes found in both ovaries. They are found to have different kinds of body tissue in the cyst. The tissue includes skin, teeth, hair, and/or cartilage. They usually do not have symptoms unless they get very big. Dermoid cysts are rarely cancerous.  POLYCYSTIC OVARY: This is a rare condition with hormone problems that produces many small cysts on both ovaries. The cysts are follicle-like cysts that never produce an egg and become a corpus luteum. It can cause an increase in body weight, infertility, acne, increase in body and facial hair and lack of menstrual periods or rare menstrual periods. Many women with this problem develop type 2 diabetes. The exact cause of this problem is unknown. A polycystic ovary is rarely cancerous.  THECA LUTEIN CYST: Occurs when too much hormone (human chorionic gonadotropin) is produced and over-stimulates the ovaries to produce an egg. They are frequently seen when doctors stimulate the ovaries for invitro-fertilization (test tube babies).  LUTEOMA CYST: This cyst is seen during pregnancy. Rarely it can cause an obstruction to the birth canal during labor and delivery. They usually go away after delivery. SYMPTOMS   Pelvic pain or pressure.  Pain during sexual intercourse.  Increasing girth (swelling) of the abdomen.  Abnormal menstrual periods.  Increasing pain with menstrual periods.  You stop having menstrual periods and you are not pregnant. DIAGNOSIS  The diagnosis can   be made during:  Routine or annual pelvic examination (common).  Ultrasound.  X-ray of the pelvis.  CT Scan.  MRI.  Blood tests. TREATMENT   Treatment may only be to follow the cyst monthly for 2 to 3 months with your caregiver. Many go away on their own, especially functional cysts.  May be aspirated (drained) with a long needle with ultrasound, or by laparoscopy (inserting a tube into  the pelvis through a small incision).  The whole cyst can be removed by laparoscopy.  Sometimes the cyst may need to be removed through an incision in the lower abdomen.  Hormone treatment is sometimes used to help dissolve certain cysts.  Birth control pills are sometimes used to help dissolve certain cysts. HOME CARE INSTRUCTIONS  Follow your caregiver's advice regarding:  Medicine.  Follow up visits to evaluate and treat the cyst.  You may need to come back or make an appointment with another caregiver, to find the exact cause of your cyst, if your caregiver is not a gynecologist.  Get your yearly and recommended pelvic examinations and Pap tests.  Let your caregiver know if you have had an ovarian cyst in the past. SEEK MEDICAL CARE IF:   Your periods are late, irregular, they stop, or are painful.  Your stomach (abdomen) or pelvic pain does not go away.  Your stomach becomes larger or swollen.  You have pressure on your bladder or trouble emptying your bladder completely.  You have painful sexual intercourse.  You have feelings of fullness, pressure, or discomfort in your stomach.  You lose weight for no apparent reason.  You feel generally ill.  You become constipated.  You lose your appetite.  You develop acne.  You have an increase in body and facial hair.  You are gaining weight, without changing your exercise and eating habits.  You think you are pregnant. SEEK IMMEDIATE MEDICAL CARE IF:   You have increasing abdominal pain.  You feel sick to your stomach (nausea) and/or vomit.  You develop a fever that comes on suddenly.  You develop abdominal pain during a bowel movement.  Your menstrual periods become heavier than usual. Document Released: 04/27/2005 Document Revised: 07/20/2011 Document Reviewed: 02/28/2009 [employer] Patient Information 2013 [employer].  

## 2012-02-24 NOTE — Progress Notes (Signed)
  Subjective:     Holly Washington is a 31 y.o. female who presents for evaluation of right sided pelvic pain. The pain is described as pressure-like and sharp. Pain is located in the RLQ area without radiation. Onset was gradual occurring 2 months ago. Symptoms have been unchanged since. Aggravating factors: sexual intercourse x1. Alleviating factors: none. Associated symptoms: none. The patient denies constipation, diarrhea, melena, nausea and vomiting. Risk factors for pelvic/abdominal pain include prior pelvic surgery and endometriosis.    Menstrual History: OB History    Grav Para Term Preterm Abortions TAB SAB Ect Mult Living                  \ Patient's last menstrual period was 01/31/2012.    The following portions of the patient's history were reviewed and updated as appropriate: allergies, current medications, past family history, past medical history, past social history, past surgical history and problem list.   Review of Systems Pertinent items are noted in HPI.    Objective:    BP 113/66  Pulse 88  Temp 96.9 F (36.1 C) (Oral)  Resp 16  Ht 5\' 7"  (1.702 m)  Wt 190 lb (86.183 kg)  BMI 29.76 kg/m2  LMP 01/31/2012 General:   alert, cooperative and no distress  Lungs:   nml effort  Heart:   not evaluated  Abdomen:  soft, nontender, nondistended  CVA:   not examined  Pelvis:  External genitalia: normal general appearance, vagina: no lesion or discharge; crvix: no lesion, no CMT, UTerus:  Anteverted, NT, adnexa:  No masses, mildly tenderin right over the right ovary.  Extremities:   extremities normal, atraumatic, no cyanosis or edema  Neurologic:   negative  Psychiatric:   normal mood, behavior, speech, dress, and thought processes   Lab Review Labs: none   Imaging Ultrasound - Pelvic Vaginal    Assessment:    Functional: will evaluate for ovarian cyst and check UPT.    Plan:    The diagnosis was discussed with the patient and evaluation and treatment  plans outlined. See orders for lab and imaging studies. Reassured patient that symptoms are almost certainly benign and self-resolving.

## 2012-03-02 ENCOUNTER — Ambulatory Visit (HOSPITAL_COMMUNITY)
Admission: RE | Admit: 2012-03-02 | Discharge: 2012-03-02 | Disposition: A | Discharge status: Home / Self Care | Payer: 59 | Source: Ambulatory Visit | Attending: Obstetrics & Gynecology | Admitting: Obstetrics & Gynecology

## 2012-03-02 DIAGNOSIS — R102 Pelvic and perineal pain: Secondary | ICD-10-CM

## 2012-03-02 DIAGNOSIS — R1031 Right lower quadrant pain: Secondary | ICD-10-CM | POA: Insufficient documentation

## 2012-03-02 DIAGNOSIS — N949 Unspecified condition associated with female genital organs and menstrual cycle: Secondary | ICD-10-CM | POA: Insufficient documentation

## 2012-03-04 ENCOUNTER — Telehealth: Payer: Self-pay | Admitting: *Deleted

## 2012-03-04 DIAGNOSIS — R102 Pelvic and perineal pain: Secondary | ICD-10-CM

## 2012-03-04 NOTE — Telephone Encounter (Signed)
Pt notified about scheduling CT and the appt made for 03/05/12 @ 10:00 @ [facility].  Pt to pick up contrast @ [facility] since she works there.

## 2012-03-05 ENCOUNTER — Ambulatory Visit (HOSPITAL_BASED_OUTPATIENT_CLINIC_OR_DEPARTMENT_OTHER): Admission: RE | Admit: 2012-03-05 | Payer: 59 | Source: Ambulatory Visit

## 2012-06-21 ENCOUNTER — Institutional Professional Consult (permissible substitution): Payer: 59 | Admitting: Nurse Practitioner

## 2012-07-12 ENCOUNTER — Institutional Professional Consult (permissible substitution): Payer: 59 | Admitting: Nurse Practitioner

## 2013-01-03 ENCOUNTER — Ambulatory Visit (INDEPENDENT_AMBULATORY_CARE_PROVIDER_SITE_OTHER): Payer: BC Managed Care – PPO | Admitting: Obstetrics & Gynecology

## 2013-01-03 ENCOUNTER — Encounter: Payer: Self-pay | Admitting: Obstetrics & Gynecology

## 2013-01-03 VITALS — BP 114/77 | HR 72 | Resp 16 | Ht 67.0 in | Wt 202.0 lb

## 2013-01-03 DIAGNOSIS — R635 Abnormal weight gain: Secondary | ICD-10-CM

## 2013-01-03 DIAGNOSIS — Z124 Encounter for screening for malignant neoplasm of cervix: Secondary | ICD-10-CM

## 2013-01-03 DIAGNOSIS — Z01419 Encounter for gynecological examination (general) (routine) without abnormal findings: Secondary | ICD-10-CM

## 2013-01-03 DIAGNOSIS — Z1151 Encounter for screening for human papillomavirus (HPV): Secondary | ICD-10-CM

## 2013-01-03 NOTE — Progress Notes (Signed)
  Subjective:     Holly Washington is a 32 y.o. female here for a routine exam.  Current complaints: weight gain in spite of working out with trainer and eating well.  Personal health questionnaire reviewed: yes.  Pt has never had an abnml pap.  She is s/p ablation and has a scant menses each month.   Gynecologic History Patient's last menstrual period was 12/09/2012. Contraception: tubal ligation Last Pap: 2012. Results were: normal Last mammogram: n/a.   Obstetric History OB History  No data available   The following portions of the patient's history were reviewed and updated as appropriate: allergies, current medications, past family history, past medical history, past social history, past surgical history and problem list.  Review of Systems Pertinent items are noted in HPI.    Objective:   Filed Vitals:   01/03/13 1444  BP: 114/77  Pulse: 72  Resp: 16  Height: 5\' 7"  (1.702 m)  Weight: 202 lb (91.627 kg)      Vitals:  WNL General appearance: alert, cooperative and no distress Head: Normocephalic, without obvious abnormality, atraumatic Eyes: negative Throat: lips, mucosa, and tongue normal; teeth and gums normal Lungs: clear to auscultation bilaterally Breasts: normal appearance, no masses or tenderness, No nipple retraction or dimpling, No nipple discharge or bleeding Heart: regular rate and rhythm Abdomen: soft, non-tender; bowel sounds normal; no masses,  no organomegaly Pelvic: cervix normal in appearance, external genitalia normal, no adnexal masses or tenderness, no bladder tenderness, no cervical motion tenderness, perianal skin: no external genital warts noted, rectovaginal septum normal, urethra without abnormality or discharge, uterus normal size, shape, and consistency and vagina normal without discharge Extremities: 1+ pitting edema up mid calf Skin: no lesions or rash Lymph nodes: Axillary adenopathy: none        Assessment:    Healthy female exam.    Plan:    Education reviewed: skin cancer screening. Contraception: tubal ligation. Follow up in: 1 year. Pap with HPV Primary care for LE edema Basic labs TSH, Vit D level

## 2013-01-05 ENCOUNTER — Telehealth: Payer: Self-pay | Admitting: *Deleted

## 2013-01-05 LAB — COMPREHENSIVE METABOLIC PANEL
ALT: 13 U/L (ref 0–35)
AST: 15 U/L (ref 0–37)
Albumin: 4.3 g/dL (ref 3.5–5.2)
Alkaline Phosphatase: 65 U/L (ref 39–117)
Calcium: 9.3 mg/dL (ref 8.4–10.5)
Chloride: 104 mEq/L (ref 96–112)
Potassium: 4.3 mEq/L (ref 3.5–5.3)
Sodium: 138 mEq/L (ref 135–145)
Total Protein: 6.9 g/dL (ref 6.0–8.3)

## 2013-01-05 LAB — CBC
HCT: 41.8 % (ref 36.0–46.0)
MCHC: 33.7 g/dL (ref 30.0–36.0)
Platelets: 193 10*3/uL (ref 150–400)
RDW: 13.7 % (ref 11.5–15.5)
WBC: 5.3 10*3/uL (ref 4.0–10.5)

## 2013-01-05 LAB — LIPID PANEL
HDL: 55 mg/dL (ref >39)
LDL Cholesterol: 58 mg/dL (ref 0–99)
Total CHOL/HDL Ratio: 2.3 Ratio
Triglycerides: 55 mg/dL (ref <150)
VLDL: 11 mg/dL (ref 0–40)

## 2013-01-05 LAB — VITAMIN D 25 HYDROXY (VIT D DEFICIENCY, FRACTURES): Vit D, 25-Hydroxy: 31 ng/mL (ref 30–89)

## 2013-01-05 NOTE — Telephone Encounter (Signed)
Pt notified by phone and a copy of labs mailed to pt's home address.

## 2013-03-09 ENCOUNTER — Ambulatory Visit: Payer: BC Managed Care – PPO | Admitting: Obstetrics & Gynecology

## 2013-03-28 ENCOUNTER — Ambulatory Visit (INDEPENDENT_AMBULATORY_CARE_PROVIDER_SITE_OTHER): Payer: BC Managed Care – PPO | Admitting: Obstetrics & Gynecology

## 2013-03-28 ENCOUNTER — Encounter: Payer: Self-pay | Admitting: Obstetrics & Gynecology

## 2013-03-28 VITALS — BP 126/77 | HR 55 | Resp 16 | Ht 67.0 in | Wt 202.0 lb

## 2013-03-28 DIAGNOSIS — R102 Pelvic and perineal pain: Secondary | ICD-10-CM

## 2013-03-28 DIAGNOSIS — N949 Unspecified condition associated with female genital organs and menstrual cycle: Secondary | ICD-10-CM

## 2013-03-29 NOTE — Progress Notes (Signed)
  Subjective:    Patient ID: Holly Washington, female    DOB: 1981-04-21, 32 y.o.   MRN: [id number]  HPI Pt is a P2 female with pain in right lower quadrant.  Pain has been sever but intermittent.  Pt had this pain last year and had a nml [place].  Pain continued so CT was ordered, but pain stopped so pt opted to not do the test.  The pain returned last month for several days which prompted anther appt.  Now pt has been asymptomatic for almost a month.  Pt is s/p Novasure and BTL.  During the laparoscopy, Dr. Marice Potter noted something on the rt side of uterus which may have been endometriosis.  There was not a biopsy taken.   Review of Systems  Constitutional: Negative.   Respiratory: Negative.   Cardiovascular: Negative.   Gastrointestinal: Negative.   Genitourinary: Positive for pelvic pain. Negative for menstrual problem.       Objective:   Physical Exam  No pain currently        Assessment & Plan:   32 yo female with right sided pelvic pain that was present last month but now resolved.  1-in depth conversation about endometriosis  --could do laparascopy with biopsy to see if disease is present  --could treat with OCPs or Lupron short term and see if pain improves 2-Discussed post ablation / tubal ligation syndrome 3-Could do CT when pain is present.  (pt has IBS and has had a CT in the past).  Pt does not feel that pain is due to IBS.  Pt opts for CT if pain returns.

## 2014-01-29 ENCOUNTER — Ambulatory Visit (INDEPENDENT_AMBULATORY_CARE_PROVIDER_SITE_OTHER): Payer: BC Managed Care – PPO | Admitting: Obstetrics & Gynecology

## 2014-01-29 ENCOUNTER — Encounter: Payer: Self-pay | Admitting: Obstetrics & Gynecology

## 2014-01-29 VITALS — BP 120/73 | HR 64 | Resp 16 | Ht 67.0 in | Wt 199.0 lb

## 2014-01-29 DIAGNOSIS — Z124 Encounter for screening for malignant neoplasm of cervix: Secondary | ICD-10-CM | POA: Diagnosis not present

## 2014-01-29 DIAGNOSIS — N930 Postcoital and contact bleeding: Secondary | ICD-10-CM

## 2014-01-29 DIAGNOSIS — Z1151 Encounter for screening for human papillomavirus (HPV): Secondary | ICD-10-CM

## 2014-01-29 DIAGNOSIS — N939 Abnormal uterine and vaginal bleeding, unspecified: Secondary | ICD-10-CM

## 2014-01-29 NOTE — Progress Notes (Signed)
   Subjective:    Patient ID: Holly Washington, female    DOB: 1980/12/04, 33 y.o.   MRN: [id number]  HPI  Pt is a 33 yo female with post coital bleeding.  Pt has history of BTL and ablation.  She had no menses for 2 1/2 yrs then menses returned.  She has a menses approx every month that last 2-3 days.  The post coital bleeding is after every sexual encounter.  She will have bright red bleeding immediately following sex and last for a day or less.  Usually a panty liner is all that is necessary to use.  Pt denies any other symptoms.   Review of Systems    See above Objective:   Physical Exam  Vitals reviewed. Constitutional: She appears well-developed and well-nourished. No distress.  HENT:  Head: Normocephalic and atraumatic.  Eyes: Conjunctivae are normal.  Pulmonary/Chest: Effort normal.  Abdominal: Soft. She exhibits no distension and no mass. There is no tenderness. There is no rebound and no guarding.  Genitourinary:  Tanner V Vagina:  Pink, nml rugae, no blood, no discharge, no lesion Cervix:  Bleeds with pap smear, no large ectropion (nulliparous--had 2 c/s). Uterus:  Anteverted, nontender Adnexa:  No masses, non tender.  Skin: Skin is warm and dry.  Psychiatric: She has a normal mood and affect.          Assessment & Plan:  33 yo female with post coital bleeding No visible lesion  1-pap today 2-If continues will get TV US 3-Discussed cryo, but may be difficult due to small os.

## 2014-01-31 ENCOUNTER — Ambulatory Visit: Payer: BC Managed Care – PPO | Admitting: Obstetrics & Gynecology

## 2014-01-31 LAB — CYTOLOGY - PAP

## 2014-02-01 ENCOUNTER — Telehealth: Payer: Self-pay | Admitting: *Deleted

## 2014-02-01 NOTE — Telephone Encounter (Signed)
Message copied by Granville Lewis on Thu Feb 01, 2014 11:28 AM ------      Message from: Lesly Dukes      Created: Thu Feb 01, 2014  8:57 AM       Nml Pap.  RN Henritta Mutz to call pt.  If bleeding continues will do TVUS. ------

## 2014-02-01 NOTE — Telephone Encounter (Signed)
LM of normal pap smear and if bleeding continues will need TVUS

## 2014-03-12 ENCOUNTER — Encounter: Payer: Self-pay | Admitting: Obstetrics & Gynecology

## 2014-09-26 ENCOUNTER — Ambulatory Visit: Payer: Self-pay | Admitting: Obstetrics & Gynecology

## 2015-02-20 ENCOUNTER — Encounter: Payer: Self-pay | Admitting: Obstetrics & Gynecology

## 2015-02-20 ENCOUNTER — Ambulatory Visit (INDEPENDENT_AMBULATORY_CARE_PROVIDER_SITE_OTHER): Payer: No Typology Code available for payment source | Admitting: Obstetrics & Gynecology

## 2015-02-20 VITALS — BP 120/72 | HR 81 | Resp 16 | Ht 67.0 in | Wt 196.0 lb

## 2015-02-20 DIAGNOSIS — Z01419 Encounter for gynecological examination (general) (routine) without abnormal findings: Secondary | ICD-10-CM | POA: Diagnosis not present

## 2015-02-20 DIAGNOSIS — R5383 Other fatigue: Secondary | ICD-10-CM | POA: Diagnosis not present

## 2015-02-20 DIAGNOSIS — F419 Anxiety disorder, unspecified: Secondary | ICD-10-CM | POA: Diagnosis not present

## 2015-02-20 LAB — COMPREHENSIVE METABOLIC PANEL
ALBUMIN: 4.5 g/dL (ref 3.6–5.1)
ALT: 32 U/L — ABNORMAL HIGH (ref 6–29)
AST: 25 U/L (ref 10–30)
Alkaline Phosphatase: 60 U/L (ref 33–115)
BUN: 12 mg/dL (ref 7–25)
CALCIUM: 9.1 mg/dL (ref 8.6–10.2)
CHLORIDE: 104 mmol/L (ref 98–110)
CO2: 28 mmol/L (ref 20–31)
CREATININE: 0.72 mg/dL (ref 0.50–1.10)
Glucose, Bld: 83 mg/dL (ref 65–99)
Potassium: 3.9 mmol/L (ref 3.5–5.3)
Sodium: 139 mmol/L (ref 135–146)
TOTAL PROTEIN: 6.7 g/dL (ref 6.1–8.1)
Total Bilirubin: 1.5 mg/dL — ABNORMAL HIGH (ref 0.2–1.2)

## 2015-02-20 LAB — CBC
HEMATOCRIT: 40.7 % (ref 36.0–46.0)
HEMOGLOBIN: 14.2 g/dL (ref 12.0–15.0)
MCH: 30.9 pg (ref 26.0–34.0)
MCHC: 34.9 g/dL (ref 30.0–36.0)
MCV: 88.7 fL (ref 78.0–100.0)
MPV: 10 fL (ref 8.6–12.4)
Platelets: 170 10*3/uL (ref 150–400)
RBC: 4.59 MIL/uL (ref 3.87–5.11)
RDW: 13.1 % (ref 11.5–15.5)
WBC: 4.4 10*3/uL (ref 4.0–10.5)

## 2015-02-20 LAB — TSH: TSH: 1.436 u[IU]/mL (ref 0.350–4.500)

## 2015-02-20 MED ORDER — CITALOPRAM HYDROBROMIDE 20 MG PO TABS: 20.0000 mg | ORAL_TABLET | Freq: Every day | ORAL | Status: DC

## 2015-02-20 NOTE — Patient Instructions (Signed)
HEAD SPACE APP  Sign up for my chart

## 2015-02-20 NOTE — Progress Notes (Signed)
  Subjective:     Holly Washington is a 34 y.o. female here for a routine exam.  Current complaints: anxiety, irritibility, mild depression, anhedonia, difficulty concentrating.    Gynecologic History Patient's last menstrual period was 02/01/2015. Contraception: tubal ligation Last Pap: 2015. Results were: normal No familial history of breast/ovarian/endometrial cancer  Obstetric History OB History  Gravida Para Term Preterm AB SAB TAB Ectopic Multiple Living  2 2 2       2     # Outcome Date GA Lbr Len/2nd Weight Sex Delivery Anes PTL Lv  2 Term     F CS-Unspec     1 Term     M CS-Unspec          The following portions of the patient's history were reviewed and updated as appropriate: allergies, current medications, past family history, past medical history, past social history, past surgical history and problem list.  Review of Systems Pertinent items are noted in HPI.    Objective:      Filed Vitals:   02/20/15 0849  BP: 120/72  Pulse: 81  Resp: 16  Height: 5\' 7"  (1.702 m)  Weight: 196 lb (88.905 kg)   Vitals:  WNL General appearance: alert, cooperative and no distress  HEENT: Normocephalic, without obvious abnormality, atraumatic Eyes: negative Throat: lips, mucosa, and tongue normal; teeth and gums normal  Respiratory: Clear to auscultation bilaterally  CV: Regular rate and rhythm  Breasts:  Normal appearance, no masses or tenderness, no nipple retraction or dimpling  GI: Soft, non-tender; bowel sounds normal; no masses,  no organomegaly  GU: External Genitalia:  Tanner V, no lesion Urethra:  No prolapse   Vagina: Pink, normal rugae, no blood or discharge  Cervix: No CMT, no lesion  Uterus:  Normal size and contour, non tender  Adnexa: Normal, no masses, non tender  Musculoskeletal: No edema, redness or tenderness in the calves or thighs  Skin: No lesions or rash  Lymphatic: Axillary adenopathy: none    Psychiatric: Normal mood and behavior           Assessment:    Healthy female exam.   Mild depression / anxiety   Plan:    Education reviewed: mindfullness.    [facility] Space App Send messge via my chart in 2 weeks about her experience Celexa for anxiety / depression Health Maintenance labs Pap due earliest 2108  RTC prn

## 2015-02-21 ENCOUNTER — Telehealth: Payer: Self-pay | Admitting: *Deleted

## 2015-02-21 LAB — VITAMIN D 25 HYDROXY (VIT D DEFICIENCY, FRACTURES): Vit D, 25-Hydroxy: 25 ng/mL — ABNORMAL LOW (ref 30–100)

## 2015-02-21 NOTE — Telephone Encounter (Signed)
LM on voicemail of lab results.  Pt's Vit D level is 25 which is low and per recommendation to start Vitamin D 800 mg daily and recheck in 3 months.

## 2015-02-22 ENCOUNTER — Other Ambulatory Visit: Payer: Self-pay | Admitting: Obstetrics & Gynecology

## 2015-02-22 NOTE — Progress Notes (Unsigned)
Vitamin D low.   Treat with Vitamin D 4000 units daily then recheck Vitamin D level in 3 months.

## 2015-02-26 ENCOUNTER — Other Ambulatory Visit: Payer: Self-pay | Admitting: *Deleted

## 2015-02-26 DIAGNOSIS — R7989 Other specified abnormal findings of blood chemistry: Secondary | ICD-10-CM

## 2015-02-26 MED ORDER — AMBULATORY NON FORMULARY MEDICATION: 1.0000 | Freq: Every day | Status: DC

## 2015-02-28 ENCOUNTER — Ambulatory Visit (INDEPENDENT_AMBULATORY_CARE_PROVIDER_SITE_OTHER): Payer: No Typology Code available for payment source | Admitting: Family Medicine

## 2015-02-28 ENCOUNTER — Ambulatory Visit (INDEPENDENT_AMBULATORY_CARE_PROVIDER_SITE_OTHER): Payer: No Typology Code available for payment source

## 2015-02-28 ENCOUNTER — Encounter: Payer: Self-pay | Admitting: Family Medicine

## 2015-02-28 VITALS — BP 132/76 | HR 63 | Ht 67.0 in | Wt 196.0 lb

## 2015-02-28 DIAGNOSIS — R1011 Right upper quadrant pain: Secondary | ICD-10-CM

## 2015-02-28 DIAGNOSIS — R110 Nausea: Secondary | ICD-10-CM | POA: Diagnosis not present

## 2015-02-28 DIAGNOSIS — R197 Diarrhea, unspecified: Secondary | ICD-10-CM

## 2015-02-28 LAB — CBC
HEMATOCRIT: 38.4 % (ref 36.0–46.0)
Hemoglobin: 13.9 g/dL (ref 12.0–15.0)
MCH: 31.2 pg (ref 26.0–34.0)
MCHC: 36.2 g/dL — ABNORMAL HIGH (ref 30.0–36.0)
MCV: 86.1 fL (ref 78.0–100.0)
MPV: 9.8 fL (ref 8.6–12.4)
PLATELETS: 184 10*3/uL (ref 150–400)
RBC: 4.46 MIL/uL (ref 3.87–5.11)
RDW: 12.9 % (ref 11.5–15.5)
WBC: 4.5 10*3/uL (ref 4.0–10.5)

## 2015-02-28 LAB — COMPREHENSIVE METABOLIC PANEL
ALK PHOS: 65 U/L (ref 33–115)
ALT: 17 U/L (ref 6–29)
AST: 18 U/L (ref 10–30)
Albumin: 4.5 g/dL (ref 3.6–5.1)
BUN: 12 mg/dL (ref 7–25)
CHLORIDE: 104 mmol/L (ref 98–110)
CO2: 27 mmol/L (ref 20–31)
Calcium: 9.2 mg/dL (ref 8.6–10.2)
Creat: 0.58 mg/dL (ref 0.50–1.10)
GLUCOSE: 81 mg/dL (ref 65–99)
POTASSIUM: 4.1 mmol/L (ref 3.5–5.3)
Sodium: 140 mmol/L (ref 135–146)
Total Bilirubin: 1.8 mg/dL — ABNORMAL HIGH (ref 0.2–1.2)
Total Protein: 7 g/dL (ref 6.1–8.1)

## 2015-02-28 LAB — POCT URINALYSIS DIPSTICK
BILIRUBIN UA: NEGATIVE
Blood, UA: NEGATIVE
GLUCOSE UA: NEGATIVE
Ketones, UA: 15
LEUKOCYTES UA: NEGATIVE
NITRITE UA: NEGATIVE
Protein, UA: NEGATIVE
Spec Grav, UA: 1.015
Urobilinogen, UA: 0.2
pH, UA: 6

## 2015-02-28 LAB — LIPASE: Lipase: 10 U/L (ref 7–60)

## 2015-02-28 LAB — POCT URINE PREGNANCY: Preg Test, Ur: NEGATIVE

## 2015-02-28 NOTE — Assessment & Plan Note (Signed)
Concerning for gallbladder disease. Symptoms may also be related to abdominal cramping due to start of Celexa. Plan to obtain CBC CMP and lipase as well as abdominal ultrasound. Will call patient with results.

## 2015-02-28 NOTE — Progress Notes (Signed)
Holly Washington Holly Washington is a 34 y.o. female who presents to [facility]  today for right upper quadrant abdominal pain. Patient is a Engineer, civil (consulting)nurse working in outpatient office. Over the past 4 days or so she's had intermittent to persistent moderate to severe pain in her right upper quadrant. This does not seem to be related to food but is associated with nausea and some diarrhea. She denies any vomiting chest pain palpitations or shortness of breath. She notes that she recently started on 20 mg of Celexa 6 days ago. She feels well otherwise.   Past Medical History  Diagnosis Date  . Headache(784.0)     topomax and maxalt prn  . Thyroid disease    Past Surgical History  Procedure Laterality Date  . Dilation and curettage of uterus  09/2010  . Breast surgery  10/2006  . C/s x 2  3/05, 11/07  . Colonscopy  9/06  . Wisdom tooth extraction    . Lasik  05/2009  . Laparoscopic tubal ligation  11/27/2010    Procedure: LAPAROSCOPIC TUBAL LIGATION;  Surgeon: Holly SalkMyra C. Marice Potterove, MD;  Location: [facility];  Service: Gynecology;  Laterality: Bilateral;  . Novasure ablation  11/27/2010    Procedure: NOVASURE ABLATION;  Surgeon: Holly SalkMyra C. Marice Potterove, MD;  Location: [facility];  Service: Gynecology;  Laterality: N/A;   Social History  Substance Use Topics  . Smoking status: Never Smoker   . Smokeless tobacco: Never Used  . Alcohol Use: No   family history includes Cancer in her father and maternal grandmother; Hyperlipidemia in her paternal grandfather and paternal grandmother; Hypertension in her father; Retinal detachment in her paternal grandmother.  ROS as above: Patient does not feel too hot or too cold or have night sweats or excessive hair loss. Medications: Current Outpatient Prescriptions  Medication Sig Dispense Refill  . BIOTIN PO Take by mouth.    . citalopram (CELEXA) 20 MG tablet Take 1 tablet (20 mg total) by mouth daily. 30 tablet 2  . ibuprofen (ADVIL,MOTRIN) 200 MG tablet Take 200  mg by mouth every 6 (six) hours as needed.    . lactobacillus acidophilus (BACID) TABS Take 2 tablets by mouth daily.    . Multiple Vitamin (MULTIVITAMIN) tablet Take 1 tablet by mouth daily.       No current facility-administered medications for this visit.   Allergies  Allergen Reactions  . Codeine Itching and Rash     Exam:  BP 132/76 mmHg  Pulse 63  Ht 5\' 7"  (1.702 m)  Wt 196 lb (88.905 kg)  BMI 30.69 kg/m2  LMP 02/01/2015 Gen: Well NAD nontoxic appearing HEENT: EOMI,  MMM Lungs: Normal work of breathing. CTABL Heart: RRR no MRG Abd: NABS, Soft. Nondistended, tender palpation right upper quadrant with positive Murphy sign. Referred pain with palpation of the upper left quadrant of the right quadrant Exts: Brisk capillary refill, warm and well perfused.  Psych: Alert and oriented normal affect and speech and thought process.  Point-of-care urinalysis significant only for trace ketones. Urine pregnancy test negative.   No results found for this or any previous visit (from the past 24 hour(s)). No results found.   Please see individual assessment and plan sections.

## 2015-02-28 NOTE — Patient Instructions (Signed)
Thank you for coming in today. Return for ultrasound downstairs today at 1pm.  NPO until the ultrasound is done.  Labs today.  I will call with results this afternoon or tomorrow.  If your belly pain worsens, or you have high fever, bad vomiting, blood in your stool or black tarry stool go to the Emergency Room.   Cholecystitis Cholecystitis is inflammation of the gallbladder. It is often called a gallbladder attack. The gallbladder is a pear-shaped organ that lies beneath the liver on the right side of the body. The gallbladder stores bile, which is a fluid that helps the body to digest fats. If bile builds up in your gallbladder, your gallbladder becomes inflamed. This condition may occur suddenly (be acute). Repeat episodes of acute cholecystitis or prolonged episodes may lead to a long-term (chronic) condition. Cholecystitis is serious and it requires treatment.  CAUSES The most common cause of this condition is gallstones. Gallstones can block the tube (duct) that carries bile out of your gallbladder. This causes bile to build up. Other causes of this condition include:  Damage to the gallbladder due to a decrease in blood flow.  Infections in the bile ducts.  Scars or kinks in the bile ducts.  Tumors in the liver, pancreas, or gallbladder. RISK FACTORS This condition is more likely to develop in:  People who have sickle cell disease.  People who take birth control pills or use estrogen.  People who have alcoholic liver disease.  People who have liver cirrhosis.  People who have their nutrition delivered through a vein (parenteral nutrition).  People who do not eat or drink (do fasting) for a long period of time.  People who are obese.  People who have rapid weight loss.  People who are pregnant.  People who have increased triglyceride levels.  People who have pancreatitis. SYMPTOMS Symptoms of this condition include:  Abdominal pain, especially in the upper right  area of the abdomen.  Abdominal tenderness or bloating.  Nausea.  Vomiting.  Fever.  Chills.  Yellowing of the skin and the whites of the eyes (jaundice). DIAGNOSIS This condition is diagnosed with a medical history and physical exam. You may also have other tests, including:  Imaging tests, such as:  An ultrasound of the gallbladder.  A CT scan of the abdomen.  A gallbladder nuclear scan (HIDA scan). This scan allows your health care provider to see the bile moving from your liver to your gallbladder and to your small intestine.  MRI.  Blood tests, such as:  A complete blood count, because the white blood cell count may be higher than normal.  Liver function tests, because some levels may be higher than normal with certain types of gallstones. TREATMENT Treatment may include:  Fasting for a certain amount of time.  IV fluids.  Medicine to treat pain or vomiting.  Antibiotic medicine.  Surgery to remove your gallbladder (cholecystectomy). This may happen immediately or at a later time. HOME CARE INSTRUCTIONS Home care will depend on your treatment. In general:  Take over-the-counter and prescription medicines only as told by your health care provider.  If you were prescribed an antibiotic medicine, take it as told by your health care provider. Do not stop taking the antibiotic even if you start to feel better.  Follow instructions from your health care provider about what to eat or drink. When you are allowed to eat, avoid eating or drinking anything that triggers your symptoms.  Keep all follow-up visits as told by  your health care provider. This is important. SEEK MEDICAL CARE IF:  Your pain is not controlled with medicine.  You have a fever. SEEK IMMEDIATE MEDICAL CARE IF:  Your pain moves to another part of your abdomen or to your back.  You continue to have symptoms or you develop new symptoms even with treatment.   This information is not intended  to replace advice given to you by your health care provider. Make sure you discuss any questions you have with your health care provider.   Document Released: 04/27/2005 Document Revised: 01/16/2015 Document Reviewed: 08/08/2014 Elsevier Interactive Patient Education [employer]2016 [employer]

## 2015-03-01 ENCOUNTER — Telehealth: Payer: Self-pay | Admitting: Family Medicine

## 2015-03-01 MED ORDER — OMEPRAZOLE 40 MG PO CPDR: 40.0000 mg | DELAYED_RELEASE_CAPSULE | Freq: Every day | ORAL | Status: DC

## 2015-03-01 NOTE — Telephone Encounter (Signed)
I called the patient with the results from the abd  ultrasound and labs. She does not feel much better. I recommend discontinuing or reducing Celexa and starting omeprazole. She started taking some leftover Zofran that she has which helps some. Discussed precautions. I scheduled her for an appointment with myself on Monday at 10:15.

## 2015-03-04 ENCOUNTER — Ambulatory Visit: Payer: No Typology Code available for payment source | Admitting: Family Medicine

## 2015-03-25 ENCOUNTER — Encounter: Payer: Self-pay | Admitting: Obstetrics & Gynecology

## 2015-03-27 NOTE — Telephone Encounter (Signed)
Would [name] like to try Wellbutrin?  It is different class.

## 2015-05-11 ENCOUNTER — Encounter: Payer: Self-pay | Admitting: Obstetrics & Gynecology

## 2015-06-03 NOTE — Telephone Encounter (Signed)
Hi Debroah,  I think Loyce Dys has called you regarding this. Please let me know if you have any further questions.

## 2016-08-17 ENCOUNTER — Encounter: Payer: Self-pay | Admitting: *Deleted

## 2016-08-17 ENCOUNTER — Encounter: Payer: Self-pay | Admitting: Obstetrics & Gynecology

## 2016-08-17 ENCOUNTER — Ambulatory Visit (INDEPENDENT_AMBULATORY_CARE_PROVIDER_SITE_OTHER): Payer: PRIVATE HEALTH INSURANCE | Admitting: Obstetrics & Gynecology

## 2016-08-17 ENCOUNTER — Emergency Department (INDEPENDENT_AMBULATORY_CARE_PROVIDER_SITE_OTHER)
Admission: EM | Admit: 2016-08-17 | Discharge: 2016-08-17 | Disposition: A | Discharge status: Home / Self Care | Payer: PRIVATE HEALTH INSURANCE | Source: Home / Self Care | Attending: Family Medicine | Admitting: Family Medicine

## 2016-08-17 ENCOUNTER — Emergency Department (INDEPENDENT_AMBULATORY_CARE_PROVIDER_SITE_OTHER): Payer: PRIVATE HEALTH INSURANCE

## 2016-08-17 VITALS — BP 134/85 | HR 66 | Resp 16 | Ht 67.0 in | Wt 202.0 lb

## 2016-08-17 DIAGNOSIS — R1031 Right lower quadrant pain: Secondary | ICD-10-CM | POA: Diagnosis not present

## 2016-08-17 DIAGNOSIS — R110 Nausea: Secondary | ICD-10-CM | POA: Diagnosis not present

## 2016-08-17 DIAGNOSIS — R102 Pelvic and perineal pain: Secondary | ICD-10-CM

## 2016-08-17 LAB — POCT CBC W AUTO DIFF (K'VILLE URGENT CARE)

## 2016-08-17 LAB — COMPLETE METABOLIC PANEL WITH GFR
ALT: 14 U/L (ref 6–29)
AST: 16 U/L (ref 10–30)
Albumin: 4.4 g/dL (ref 3.6–5.1)
Alkaline Phosphatase: 63 U/L (ref 33–115)
BUN: 14 mg/dL (ref 7–25)
CO2: 28 mmol/L (ref 20–31)
Calcium: 9.5 mg/dL (ref 8.6–10.2)
Chloride: 104 mmol/L (ref 98–110)
Creat: 0.75 mg/dL (ref 0.50–1.10)
GFR, Est African American: >89 mL/min (ref >=60)
GFR, Est Non African American: >89 mL/min (ref >=60)
Glucose, Bld: 87 mg/dL (ref 65–99)
Potassium: 4 mmol/L (ref 3.5–5.3)
Sodium: 138 mmol/L (ref 135–146)
Total Bilirubin: 1.3 mg/dL — ABNORMAL HIGH (ref 0.2–1.2)
Total Protein: 6.8 g/dL (ref 6.1–8.1)

## 2016-08-17 LAB — POCT URINALYSIS DIP (MANUAL ENTRY)
Bilirubin, UA: NEGATIVE
Blood, UA: NEGATIVE
Glucose, UA: NEGATIVE
Ketones, POC UA: NEGATIVE
Leukocytes, UA: NEGATIVE
Nitrite, UA: NEGATIVE
Protein Ur, POC: NEGATIVE
Spec Grav, UA: 1.015 (ref 1.030–1.035)
Urobilinogen, UA: NEGATIVE (ref ?–2.0)
pH, UA: 6 (ref 5.0–8.0)

## 2016-08-17 MED ORDER — IOPAMIDOL (ISOVUE-300) INJECTION 61%
100.0000 mL | Freq: Once | INTRAVENOUS | Status: AC | PRN
  Administered 2016-08-17: 100 mL via INTRAVENOUS

## 2016-08-17 NOTE — Progress Notes (Signed)
   Subjective:    Patient ID: Holly Washington, female    DOB: 17-Feb-1981, 36 y.o.   MRN: [id number]  HPI  36 yo MW P2 here with a 1 week h/o RLQ pain. She had a CT today after going to urgent care.  Review of Systems She has had a BTL. Her periods are monthly    Objective:   Physical Exam Pleasant [id number] Breathing, conversing, and ambulating normally Abd- benign NSSA, NT, mobile, no adnexal masses, some tenderness in the RLQ        Assessment & Plan:  RLQ pain- I suspect that this is ovulation pain Rec IBU 600 mg q8 hours prn RTC 1 week for u/s if not better

## 2016-08-17 NOTE — ED Triage Notes (Signed)
Pt c/o RLQ abd pain x 1 wk, nausea today. Denies vomiting, diarrhea or fever. Denies hx of kidney stones.

## 2016-08-17 NOTE — ED Provider Notes (Signed)
CSN: [id number]     Arrival date & time 08/17/16  1124 History   First MD Initiated Contact with Patient 08/17/16 1147     Chief Complaint  Patient presents with  . Abdominal Pain   (Consider location/radiation/quality/duration/timing/severity/associated sxs/prior Treatment) HPI  Holly Washington is a 36 y.o. female presenting to UC with c/o RLQ abdominal pain that started about 1 week ago.  Pain was initially constant and sharp in nature but over the last week it has become intermittent, aching.  Yesterday and today pain is more severe.  Minimal nausea. Denies fever, chills, vomiting or diarrhea. Denies urinary or vaginal symptoms.  Hx of RUQ abdominal pain in 2016 but pt notes it was due to a medication, once that medication was stopped, the pain resolved.  Hx of laparoscopic tubal ligation and was seen by her OB/GYN in 2013 for RLQ pain but pt notes that pain was much different.  She does still have her appendix. She had had water and a shake this morning for breakfast.    Past Medical History:  Diagnosis Date  . Headache(784.0)    topomax and maxalt prn  . Thyroid disease    Past Surgical History:  Procedure Laterality Date  . BREAST SURGERY  10/2006  . c/s x 2  3/05, 11/07  . colonscopy  9/06  . DILATION AND CURETTAGE OF UTERUS  09/2010  . LAPAROSCOPIC TUBAL LIGATION  11/27/2010   Procedure: LAPAROSCOPIC TUBAL LIGATION;  Surgeon: Hollie Salk C. Marice Potter, MD;  Location: [facility];  Service: Gynecology;  Laterality: Bilateral;  . LASIK  05/2009  . NOVASURE ABLATION  11/27/2010   Procedure: NOVASURE ABLATION;  Surgeon: Hollie Salk C. Marice Potter, MD;  Location: [facility];  Service: Gynecology;  Laterality: N/A;  . WISDOM TOOTH EXTRACTION     Family History  Problem Relation Age of Onset  . Cancer Father     bladder  . Hypertension Father   . Cancer Maternal Grandmother     lung  . Hyperlipidemia Paternal Grandmother   . Retinal detachment Paternal Grandmother   . Hyperlipidemia Paternal Grandfather     Social History  Substance Use Topics  . Smoking status: Never Smoker  . Smokeless tobacco: Never Used  . Alcohol use No   OB History    Gravida Para Term Preterm AB Living   SAB TAB Ectopic Multiple Live Births                 Review of Systems  Constitutional: Positive for appetite change (decreased). Negative for chills and fever.  HENT: Negative for congestion, ear pain, sore throat, trouble swallowing and voice change.   Respiratory: Negative for cough and shortness of breath.   Cardiovascular: Negative for chest pain and palpitations.  Gastrointestinal: Positive for abdominal pain and nausea. Negative for diarrhea and vomiting.  Musculoskeletal: Negative for arthralgias, back pain and myalgias.  Skin: Negative for rash.    Allergies  Codeine  Home Medications   Prior to Admission medications   Medication Sig Start Date End Date Taking? Authorizing Provider  BIOTIN PO Take by mouth.    Historical Provider, MD  ibuprofen (ADVIL,MOTRIN) 200 MG tablet Take 200 mg by mouth every 6 (six) hours as needed.    Historical Provider, MD  lactobacillus acidophilus (BACID) TABS Take 2 tablets by mouth daily.    Historical Provider, MD  Multiple Vitamin (MULTIVITAMIN) tablet Take 1 tablet by mouth daily.  Historical Provider, MD  omeprazole (PRILOSEC) 40 MG capsule Take 1 capsule (40 mg total) by mouth daily. 03/01/15   Rodolph Bong, MD  silver sulfADIAZINE (SILVADENE) 1 % cream  08/12/16   Historical Provider, MD   Meds Ordered and Administered this Visit  Medications - No data to display  BP 139/84 (BP Location: Left Arm)   Pulse 67   Temp 97.9 F (36.6 C) (Oral)   Resp 16   Ht  (1.702 m)   Wt 202 lb (91.6 kg)   LMP 07/10/2016   SpO2 100%   BMI 31.64 kg/m  No data found.   Physical Exam  Constitutional: She appears well-developed and well-nourished. No distress.  Pt sitting comfortably on exam bed, NAD  HENT:  Head: Normocephalic and  atraumatic.  Mouth/Throat: Oropharynx is clear and moist.  Eyes: Conjunctivae are normal. No scleral icterus.  Neck: Normal range of motion.  Cardiovascular: Normal rate, regular rhythm and normal heart sounds.   Pulmonary/Chest: Effort normal and breath sounds normal. No respiratory distress. She has no wheezes. She has no rales. She exhibits no tenderness.  Abdominal: Soft. Bowel sounds are normal. She exhibits no distension and no mass. There is tenderness. There is rebound. There is no guarding and no CVA tenderness.    Musculoskeletal: Normal range of motion.  Neurological: She is alert.  Skin: Skin is warm and dry. She is not diaphoretic.  Nursing note and vitals reviewed.   Urgent Care Course     Procedures (including critical care time)  Labs Review Labs Reviewed  COMPLETE METABOLIC PANEL WITH GFR  POCT URINALYSIS DIP (MANUAL ENTRY)  POCT CBC W AUTO DIFF ([facility])    Imaging Review Ct Abdomen Pelvis W Contrast  Result Date: 08/17/2016 CLINICAL DATA:  Right lower quadrant pain for 1 week with nausea today. EXAM: CT ABDOMEN AND PELVIS WITH CONTRAST TECHNIQUE: Multidetector CT imaging of the abdomen and pelvis was performed using the standard protocol following bolus administration of intravenous contrast. CONTRAST:  ISOVUE-300 IOPAMIDOL (ISOVUE-300) INJECTION 61% COMPARISON:  None. FINDINGS: Lower chest: Negative. Heart size normal. No pericardial or pleural effusion. Hepatobiliary: Liver and gallbladder are unremarkable. No biliary ductal dilatation. Pancreas: Negative. Spleen: Negative. Adrenals/Urinary Tract: Adrenal glands and kidneys are unremarkable. Ureters are decompressed. Bladder is grossly unremarkable. Stomach/Bowel: Stomach, small bowel, appendix and colon are unremarkable. Vascular/Lymphatic: Vascular structures are unremarkable. No pathologically enlarged lymph nodes. Reproductive: Uterus is visualized.  No adnexal mass. Other: No free fluid.   Mesenteries and peritoneum are unremarkable. Musculoskeletal: No worrisome lytic or sclerotic lesions. IMPRESSION: Negative exam.  No findings to explain the patient's pain. Electronically Signed   By: Leanna Battles M.D.   On: 08/17/2016 13:31     MDM   1. RLQ abdominal pain    Pt c/o RLQ abdominal pain for about 1 week, gradually worsening.  UA: normal CBC: WNL CMP: pending  CT abd: no acute findings. No findings to explain pt's pain.  Reassured pt of normal workup.  Encouraged f/u with PCP or OB/GYN as she has had similar pain in the past.      Junius Finner, PA-C 08/17/16 1429

## 2016-08-18 ENCOUNTER — Telehealth: Payer: Self-pay | Admitting: *Deleted

## 2016-08-18 NOTE — Telephone Encounter (Signed)
Callback: No answer, LMOM labs WNL. f/u with Dr. Marice Potter as needed.

## 2017-07-28 ENCOUNTER — Ambulatory Visit: Payer: PRIVATE HEALTH INSURANCE | Admitting: Obstetrics & Gynecology

## 2017-09-21 ENCOUNTER — Ambulatory Visit: Payer: PRIVATE HEALTH INSURANCE | Admitting: Obstetrics & Gynecology

## 2017-10-11 ENCOUNTER — Ambulatory Visit (INDEPENDENT_AMBULATORY_CARE_PROVIDER_SITE_OTHER): Payer: PRIVATE HEALTH INSURANCE | Admitting: Obstetrics & Gynecology

## 2017-10-11 ENCOUNTER — Encounter: Payer: Self-pay | Admitting: Obstetrics & Gynecology

## 2017-10-11 VITALS — BP 131/83 | HR 65 | Resp 16 | Ht 67.0 in | Wt 202.0 lb

## 2017-10-11 DIAGNOSIS — Z124 Encounter for screening for malignant neoplasm of cervix: Secondary | ICD-10-CM | POA: Diagnosis not present

## 2017-10-11 DIAGNOSIS — Z01419 Encounter for gynecological examination (general) (routine) without abnormal findings: Secondary | ICD-10-CM | POA: Diagnosis not present

## 2017-10-11 DIAGNOSIS — Z1151 Encounter for screening for human papillomavirus (HPV): Secondary | ICD-10-CM

## 2017-10-11 NOTE — Progress Notes (Signed)
Subjective:     Holly BoerDeborah H Washington is a 37 y.o. female here for a routine exam.  Current complaints: none today.  Pt working for [employer].   Children and family are well   She is exercising every morning at 5 am.  .     Gynecologic History Patient's last menstrual period was 09/20/2017. Contraception: tubal ligation Last Pap: 2015. Results were: normal Last mammogram: n/a  Obstetric History OB History  Gravida Para Term Preterm AB Living  2 2 2     2   SAB TAB Ectopic Multiple Live Births               # Outcome Date GA Lbr Len/2nd Weight Sex Delivery Anes PTL Lv  2 Term     F CS-Unspec     1 Term     M CS-Unspec        The following portions of the patient's history were reviewed and updated as appropriate: allergies, current medications, past family history, past medical history, past social history, past surgical history and problem list.  Review of Systems Pertinent items noted in HPI and remainder of comprehensive ROS otherwise negative.    Objective:      Vitals:   10/11/17 1256  BP: 131/83  Pulse: 65  Resp: 16  Weight: 202 lb (91.6 kg)  Height: 5\' 7"  (1.702 m)   Vitals:  WNL General appearance: alert, cooperative and no distress  HEENT: Normocephalic, without obvious abnormality, atraumatic Eyes: negative Throat: lips, mucosa, and tongue normal; teeth and gums normal  Respiratory: Clear to auscultation bilaterally  CV: Regular rate and rhythm  Breasts:  Normal appearance, no masses or tenderness, no nipple retraction or dimpling; implants  GI: Soft, non-tender; bowel sounds normal; no masses,  no organomegaly  GU: External Genitalia:  Tanner V, no lesion Urethra:  No prolapse   Vagina: Pink, normal rugae, no blood or discharge  Cervix: No CMT, no lesion  Uterus:  Normal size and contour, non tender  Adnexa: Normal, no masses, non tender  Musculoskeletal: No edema, redness or tenderness in the calves or thighs  Skin: No lesions or rash  Lymphatic:  Axillary adenopathy: none     Psychiatric: Normal mood and behavior        Assessment:    Healthy female exam.    Plan:  Pap with co testing Health maintenance labs (fasting)--later this week Mammogram at 38 or later with joint disci making.

## 2017-10-13 LAB — CYTOLOGY - PAP
Diagnosis: NEGATIVE
HPV: NOT DETECTED

## 2017-10-14 ENCOUNTER — Other Ambulatory Visit: Payer: PRIVATE HEALTH INSURANCE

## 2017-10-14 DIAGNOSIS — Z01419 Encounter for gynecological examination (general) (routine) without abnormal findings: Secondary | ICD-10-CM

## 2017-10-15 ENCOUNTER — Other Ambulatory Visit: Payer: Self-pay | Admitting: Obstetrics & Gynecology

## 2017-10-15 LAB — COMPREHENSIVE METABOLIC PANEL
AG Ratio: 2 (calc) (ref 1.0–2.5)
ALBUMIN MSPROF: 4.1 g/dL (ref 3.6–5.1)
ALKALINE PHOSPHATASE (APISO): 49 U/L (ref 33–115)
ALT: 11 U/L (ref 6–29)
AST: 14 U/L (ref 10–30)
BUN: 11 mg/dL (ref 7–25)
CO2: 28 mmol/L (ref 20–32)
Calcium: 8.8 mg/dL (ref 8.6–10.2)
Chloride: 107 mmol/L (ref 98–110)
Creat: 0.74 mg/dL (ref 0.50–1.10)
GLOBULIN: 2.1 g/dL (ref 1.9–3.7)
GLUCOSE: 92 mg/dL (ref 65–99)
Potassium: 4.3 mmol/L (ref 3.5–5.3)
Sodium: 141 mmol/L (ref 135–146)
Total Bilirubin: 1.2 mg/dL (ref 0.2–1.2)
Total Protein: 6.2 g/dL (ref 6.1–8.1)

## 2017-10-15 LAB — LIPID PANEL
Cholesterol: 141 mg/dL
HDL: 57 mg/dL
LDL Cholesterol (Calc): 70 mg/dL
Non-HDL Cholesterol (Calc): 84 mg/dL
Total CHOL/HDL Ratio: 2.5 (calc)
Triglycerides: 62 mg/dL

## 2017-10-15 LAB — CBC
HCT: 39.1 % (ref 35.0–45.0)
Hemoglobin: 13.8 g/dL (ref 11.7–15.5)
MCH: 31.2 pg (ref 27.0–33.0)
MCHC: 35.3 g/dL (ref 32.0–36.0)
MCV: 88.3 fL (ref 80.0–100.0)
MPV: 10.3 fL (ref 7.5–12.5)
Platelets: 184 10*3/uL (ref 140–400)
RBC: 4.43 Million/uL (ref 3.80–5.10)
RDW: 12.3 % (ref 11.0–15.0)
WBC: 4.2 10*3/uL (ref 3.8–10.8)

## 2017-10-15 LAB — TSH: TSH: 1.14 mIU/L

## 2017-10-15 LAB — VITAMIN D 25 HYDROXY (VIT D DEFICIENCY, FRACTURES): Vit D, 25-Hydroxy: 21 ng/mL — ABNORMAL LOW (ref 30–100)

## 2017-10-15 MED ORDER — VITAMIN D (ERGOCALCIFEROL) 1.25 MG (50000 UNIT) PO CAPS: 50000.0000 [IU] | ORAL_CAPSULE | ORAL | 0 refills | Status: DC

## 2017-10-18 ENCOUNTER — Telehealth: Payer: Self-pay | Admitting: *Deleted

## 2017-10-18 MED ORDER — VITAMIN D (ERGOCALCIFEROL) 1.25 MG (50000 UNIT) PO CAPS: 50000.0000 [IU] | ORAL_CAPSULE | ORAL | 0 refills | Status: DC

## 2017-10-18 NOTE — Telephone Encounter (Signed)
Pt notified of latest lab work and low vitamin D.  Dr Penne LashLeggett send the original RX of Vitamin D to [facility] but pt is requesting it be sent to [facility].  RX switched

## 2017-12-03 ENCOUNTER — Emergency Department (INDEPENDENT_AMBULATORY_CARE_PROVIDER_SITE_OTHER)
Admission: EM | Admit: 2017-12-03 | Discharge: 2017-12-03 | Disposition: A | Discharge status: Home / Self Care | Payer: PRIVATE HEALTH INSURANCE | Source: Home / Self Care | Attending: Family Medicine | Admitting: Family Medicine

## 2017-12-03 ENCOUNTER — Encounter: Payer: Self-pay | Admitting: Emergency Medicine

## 2017-12-03 DIAGNOSIS — R5383 Other fatigue: Secondary | ICD-10-CM

## 2017-12-03 DIAGNOSIS — W57XXXA Bitten or stung by nonvenomous insect and other nonvenomous arthropods, initial encounter: Secondary | ICD-10-CM

## 2017-12-03 MED ORDER — DOXYCYCLINE HYCLATE 100 MG PO CAPS: 100.0000 mg | ORAL_CAPSULE | Freq: Two times a day (BID) | ORAL | 0 refills | Status: DC

## 2017-12-03 NOTE — ED Provider Notes (Signed)
[facility]    CSN: [id number] Arrival date & time: 12/03/17  1315     History   Chief Complaint Chief Complaint  Patient presents with  . Fatigue    HPI Chip BoerDeborah H Bourquin is a 37 y.o. female.   HPI Chip BoerDeborah H Gibler is a 37 y.o. female presenting to UC with c/o fatigue, nausea, and swollen neck glans for 5 days.  She pulled a tick off the Right side of her neck on 11/25/17.  She does not believe the tick was there for more than 2-4 hours but has had soreness and redness at the area of the bite and around right side of neck. Mild nausea and chills. No vomiting or diarrhea. No medication taken PTA. [facility] but no specific sick contact.    Past Medical History:  Diagnosis Date  . Headache(784.0)    topomax and maxalt prn  . Thyroid disease     Patient Active Problem List   Diagnosis Date Noted  . Pelvic pain in female 02/24/2012    Past Surgical History:  Procedure Laterality Date  . BREAST SURGERY  10/2006  . c/s x 2  3/05, 11/07  . colonscopy  9/06  . DILATION AND CURETTAGE OF UTERUS  09/2010  . LAPAROSCOPIC TUBAL LIGATION  11/27/2010   Procedure: LAPAROSCOPIC TUBAL LIGATION;  Surgeon: Hollie SalkMyra C. Marice Potterove, MD;  Location: [facility];  Service: Gynecology;  Laterality: Bilateral;  . LASIK  05/2009  . NOVASURE ABLATION  11/27/2010   Procedure: NOVASURE ABLATION;  Surgeon: Hollie SalkMyra C. Marice Potterove, MD;  Location: [facility];  Service: Gynecology;  Laterality: N/A;  . WISDOM TOOTH EXTRACTION      OB History    Gravida  2   Para  2   Term  2   Preterm      AB      Living  2     SAB      TAB      Ectopic      Multiple      Live Births               Home Medications    Prior to Admission medications   Medication Sig Start Date End Date Taking? Authorizing Provider  BIOTIN PO Take by mouth.    [provider]  doxycycline (VIBRAMYCIN) 100 MG capsule Take 1 capsule (100 mg total) by mouth 2 (two) times daily. One po bid x 7  days 12/03/17   Lurene ShadowPhelps, Kurtiss Wence O, PA-C  ibuprofen (ADVIL,MOTRIN) 200 MG tablet Take 200 mg by mouth every 6 (six) hours as needed.    [provider]  lactobacillus acidophilus (BACID) TABS Take 2 tablets by mouth daily.    [provider]  Multiple Vitamin (MULTIVITAMIN) tablet Take 1 tablet by mouth daily.      [provider]  omeprazole (PRILOSEC) 40 MG capsule Take 1 capsule (40 mg total) by mouth daily. 03/01/15   Rodolph Bongorey, Evan S, MD  silver sulfADIAZINE (SILVADENE) 1 % cream  08/12/16   [provider]  Vitamin D, Ergocalciferol, (DRISDOL) 50000 units CAPS capsule Take 1 capsule (50,000 Units total) by mouth every 7 (seven) days. 10/18/17   Lesly DukesLeggett, Kelly H, MD    Family History Family History  Problem Relation Age of Onset  . Cancer Father        bladder  . Hypertension Father   . Cancer Maternal Grandmother  lung  . Hyperlipidemia Paternal Grandmother   . Retinal detachment Paternal Grandmother   . Hyperlipidemia Paternal Grandfather     Social History Social History   Tobacco Use  . Smoking status: Never Smoker  . Smokeless tobacco: Never Used  Substance Use Topics  . Alcohol use: No  . Drug use: No     Allergies   Codeine   Review of Systems Review of Systems  Constitutional: Positive for chills and fatigue. Negative for diaphoresis and fever.  Gastrointestinal: Positive for nausea. Negative for diarrhea and vomiting.  Musculoskeletal: Positive for myalgias.       Body aches  Skin: Positive for color change and wound. Negative for rash.     Physical Exam Triage Vital Signs ED Triage Vitals [12/03/17 1338]  Enc Vitals Group     BP (!) 142/88     Pulse Rate 76     Resp      Temp 98.1 F (36.7 C)     Temp Source Oral     SpO2 100 %     Weight 200 lb (90.7 kg)     Height      Head Circumference      Peak Flow      Pain Score 0     Pain Loc      Pain Edu?      Excl. in GC?    No data found.  Updated Vital  Signs BP (!) 142/88 (BP Location: Right Arm)   Pulse 76   Temp 98.1 F (36.7 C) (Oral)   Wt 200 lb (90.7 kg)   LMP 11/11/2017   SpO2 100%   BMI 31.32 kg/m   Visual Acuity Right Eye Distance:   Left Eye Distance:   Bilateral Distance:    Right Eye Near:   Left Eye Near:    Bilateral Near:     Physical Exam  Constitutional: She is oriented to person, place, and time. She appears well-developed and well-nourished. No distress.  HENT:  Head: Normocephalic and atraumatic.  Right Ear: Tympanic membrane normal.  Left Ear: Tympanic membrane normal.  Nose: Nose normal. Right sinus exhibits no maxillary sinus tenderness and no frontal sinus tenderness. Left sinus exhibits no maxillary sinus tenderness and no frontal sinus tenderness.  Mouth/Throat: Uvula is midline, oropharynx is clear and moist and mucous membranes are normal.  Eyes: EOM are normal.  Neck: Normal range of motion. Neck supple.    Two 2mm erythematous pustules. Mildly tender. No bleeding or drainage.   Cardiovascular: Normal rate and regular rhythm.  Pulmonary/Chest: Effort normal and breath sounds normal. No stridor. No respiratory distress. She has no wheezes. She has no rales.  Musculoskeletal: Normal range of motion.  Lymphadenopathy:    She has cervical adenopathy ( Right anterior cervical and Right preauricular).  Neurological: She is alert and oriented to person, place, and time.  Skin: Skin is warm and dry. She is not diaphoretic. There is erythema.  Psychiatric: She has a normal mood and affect. Her behavior is normal.  Nursing note and vitals reviewed.    UC Treatments / Results  Labs (all labs ordered are listed, but only abnormal results are displayed) Labs Reviewed - No data to display  EKG None  Radiology No results found.  Procedures Procedures (including critical care time)  Medications Ordered in UC Medications - No data to display  Initial Impression / Assessment and Plan / UC  Course  I have reviewed the triage vital signs and the  nursing notes.  Pertinent labs & imaging results that were available during my care of the patient were reviewed by me and considered in my medical decision making (see chart for details).     Given short duration of contact with tick, unlikely to have RMSF or Lyme's Disease, however, will still start on doxycycline to cover for secondary skin infection at bite site.  Final Clinical Impressions(s) / UC Diagnoses   Final diagnoses:  Fatigue, unspecified type  Infected tick bite, initial encounter     Discharge Instructions      You may take 500mg  acetaminophen every 4-6 hours or in combination with ibuprofen 400-600mg  every 6-8 hours as needed for pain, inflammation, and fever.  Please take antibiotics as prescribed and be sure to complete entire course even if you start to feel better to ensure infection does not come back.  Please follow up with Dr. Denyse Amass next week if not improving.     ED Prescriptions    Medication Sig Dispense Auth. Provider   doxycycline (VIBRAMYCIN) 100 MG capsule Take 1 capsule (100 mg total) by mouth 2 (two) times daily. One po bid x 7 days 14 capsule Lurene Shadow, [place]     Controlled Substance Prescriptions [place] Controlled Substance Registry consulted? Not Applicable   Rolla Plate 12/03/17 1610

## 2017-12-03 NOTE — ED Triage Notes (Signed)
Pt states she has fatigue, nausea and swollen glands since Monday. States last Thursday she pulled a tick off of the right side of her neck.

## 2017-12-03 NOTE — Discharge Instructions (Signed)
°  You may take 500mg  acetaminophen every 4-6 hours or in combination with ibuprofen 400-600mg  every 6-8 hours as needed for pain, inflammation, and fever.  Please take antibiotics as prescribed and be sure to complete entire course even if you start to feel better to ensure infection does not come back.  Please follow up with Dr. Denyse Amassorey next week if not improving.

## 2018-01-05 ENCOUNTER — Other Ambulatory Visit: Payer: Self-pay | Admitting: *Deleted

## 2018-01-05 MED ORDER — DULOXETINE HCL 30 MG PO CPEP: 30.0000 mg | ORAL_CAPSULE | Freq: Every day | ORAL | 3 refills | Status: DC

## 2018-01-05 NOTE — Progress Notes (Signed)
Pt started on Cymbalta 30 mg daily.  Will reevaluate after 6 weeks if need to increase to 60 mg daily per VO Dr Penne LashLeggett

## 2018-02-23 ENCOUNTER — Other Ambulatory Visit: Payer: Self-pay | Admitting: Obstetrics & Gynecology

## 2018-02-23 MED ORDER — DULOXETINE HCL 60 MG PO CPEP
60.0000 mg | ORAL_CAPSULE | Freq: Every day | ORAL | 3 refills | Status: DC
Start: 2018-02-23 — End: 2018-02-28

## 2018-02-23 NOTE — Progress Notes (Signed)
Pt's anxiety and depression scores have improved but not entirely.  Will increase dose duloxetine to 60 mg/day.

## 2018-02-28 ENCOUNTER — Telehealth: Payer: Self-pay | Admitting: *Deleted

## 2018-02-28 MED ORDER — DULOXETINE HCL 60 MG PO CPEP: 60.0000 mg | ORAL_CAPSULE | Freq: Every day | ORAL | 3 refills | Status: DC

## 2018-02-28 NOTE — Telephone Encounter (Signed)
Pt called stating that her Cymbalta had been sent to the wrong drug store.  It should have gone to [facility].  Meds sent.

## 2018-07-01 ENCOUNTER — Other Ambulatory Visit: Payer: Self-pay | Admitting: Obstetrics & Gynecology

## 2018-07-04 ENCOUNTER — Other Ambulatory Visit: Payer: Self-pay | Admitting: *Deleted

## 2018-07-04 MED ORDER — DULOXETINE HCL 60 MG PO CPEP: 60.0000 mg | ORAL_CAPSULE | Freq: Every day | ORAL | 3 refills | Status: DC

## 2018-07-04 NOTE — Telephone Encounter (Signed)
Pt called requesting a RF on Cymbalta 60 mg.  She states she is doing well on the med and is going to counseling.

## 2018-10-06 ENCOUNTER — Other Ambulatory Visit: Payer: Self-pay | Admitting: Obstetrics & Gynecology

## 2018-11-09 ENCOUNTER — Other Ambulatory Visit: Payer: Self-pay | Admitting: *Deleted

## 2018-11-09 MED ORDER — DULOXETINE HCL 60 MG PO CPEP: 60.0000 mg | ORAL_CAPSULE | Freq: Every day | ORAL | 3 refills | Status: DC

## 2018-11-09 NOTE — Telephone Encounter (Signed)
Pt called requesting a RF on Cymbalta.  She states that her pharmacy sent a RF fax but we had not received one.  I RF the med to [facility].

## 2018-11-28 ENCOUNTER — Encounter: Payer: Self-pay | Admitting: *Deleted

## 2018-11-28 NOTE — Progress Notes (Unsigned)
Pt is having a hrad time coping with the death of her gfather who raised her.  She had to do CPR on him and he passed away.  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  She is in therapy and takes Cymbalta for depression.  She is requesting FMLA for 3 weeks due to stress and the dealing of her gfather's death.  She wa very tearful on the phone.  FMLA filled out for her to take intermittent leave from 7/27-8/17/20.  She will be reaccessed after that by her PCP.

## 2018-11-29 ENCOUNTER — Encounter: Payer: Self-pay | Admitting: *Deleted

## 2019-02-10 ENCOUNTER — Other Ambulatory Visit: Payer: Self-pay | Admitting: Obstetrics & Gynecology

## 2019-02-10 DIAGNOSIS — Z1231 Encounter for screening mammogram for malignant neoplasm of breast: Secondary | ICD-10-CM

## 2019-02-16 ENCOUNTER — Other Ambulatory Visit: Payer: Self-pay

## 2019-02-16 ENCOUNTER — Ambulatory Visit (INDEPENDENT_AMBULATORY_CARE_PROVIDER_SITE_OTHER): Payer: Commercial Managed Care - PPO

## 2019-02-16 DIAGNOSIS — Z1231 Encounter for screening mammogram for malignant neoplasm of breast: Secondary | ICD-10-CM

## 2019-03-13 ENCOUNTER — Other Ambulatory Visit: Payer: Self-pay

## 2019-03-13 ENCOUNTER — Encounter: Payer: Self-pay | Admitting: Obstetrics & Gynecology

## 2019-03-13 ENCOUNTER — Ambulatory Visit (INDEPENDENT_AMBULATORY_CARE_PROVIDER_SITE_OTHER): Payer: No Typology Code available for payment source | Admitting: Obstetrics & Gynecology

## 2019-03-13 VITALS — BP 125/80 | HR 80 | Resp 16 | Ht 67.0 in | Wt 204.0 lb

## 2019-03-13 DIAGNOSIS — Z01419 Encounter for gynecological examination (general) (routine) without abnormal findings: Secondary | ICD-10-CM

## 2019-03-13 MED ORDER — DULOXETINE HCL 60 MG PO CPEP: 60.0000 mg | ORAL_CAPSULE | Freq: Every day | ORAL | 12 refills | Status: DC

## 2019-03-13 NOTE — Progress Notes (Signed)
Subjective:     Holly Washington is a 38 y.o. female here for a routine exam.  Current complaints: joint pain--ANA positive; has appt with rheumatologist.     Gynecologic History Patient's last menstrual period was 02/18/2019. Contraception: tubal ligation Last Pap: 2019. Results were: normal Last mammogram: 2020. Results were: normal  Obstetric History OB History  Gravida Para Term Preterm AB Living  2 2 2     2   SAB TAB Ectopic Multiple Live Births               # Outcome Date GA Lbr Len/2nd Weight Sex Delivery Anes PTL Lv  2 Term     F CS-Unspec     1 Term     M CS-Unspec        The following portions of the patient's history were reviewed and updated as appropriate: allergies, current medications, past family history, past medical history, past social history, past surgical history and problem list.  Review of Systems Pertinent items noted in HPI and remainder of comprehensive ROS otherwise negative.    Objective:      Vitals:   03/13/19 0812  BP: 125/80  Pulse: 80  Resp: 16  Weight: 204 lb (92.5 kg)  Height: 5\' 7"  (1.702 m)   Vitals:  WNL General appearance: alert, cooperative and no distress  HEENT: Normocephalic, without obvious abnormality, atraumatic Eyes: negative Throat: lips, mucosa, and tongue normal; teeth and gums normal  Respiratory: Clear to auscultation bilaterally  CV: Regular rate and rhythm  Breasts:  Normal appearance, no masses or tenderness, no nipple retraction or dimpling  GI: Soft, non-tender; bowel sounds normal; no masses,  no organomegaly  GU: External Genitalia:  Tanner V, no lesion Urethra:  No prolapse   Vagina: Pink, normal rugae, no blood or discharge  Cervix: No CMT, no lesion  Uterus:  Normal size and contour, non tender  Adnexa: Normal, no masses, non tender  Musculoskeletal: No edema, redness or tenderness in the calves or thighs  Skin: No lesions or rash  Lymphatic: Axillary adenopathy: none     Psychiatric: Normal  mood and behavior   Assessment:    Healthy female exam.    Plan:    Pap up to date Mammogram up to date Flu shot up to date Refill on Cymbalta Tubal for contraception Doing well post ablation

## 2020-01-19 ENCOUNTER — Other Ambulatory Visit: Payer: Self-pay | Admitting: Obstetrics & Gynecology

## 2020-01-19 DIAGNOSIS — Z1231 Encounter for screening mammogram for malignant neoplasm of breast: Secondary | ICD-10-CM

## 2020-01-25 ENCOUNTER — Telehealth: Payer: Self-pay | Admitting: *Deleted

## 2020-01-25 NOTE — Telephone Encounter (Signed)
Left patient a message to call and schedule annual with Dr. Penne Lash, starting 03/21/2020.

## 2020-02-21 ENCOUNTER — Other Ambulatory Visit: Payer: Self-pay

## 2020-02-21 ENCOUNTER — Ambulatory Visit (INDEPENDENT_AMBULATORY_CARE_PROVIDER_SITE_OTHER): Payer: No Typology Code available for payment source

## 2020-02-21 DIAGNOSIS — Z1231 Encounter for screening mammogram for malignant neoplasm of breast: Secondary | ICD-10-CM

## 2020-02-22 ENCOUNTER — Ambulatory Visit: Payer: No Typology Code available for payment source

## 2020-03-25 ENCOUNTER — Ambulatory Visit (INDEPENDENT_AMBULATORY_CARE_PROVIDER_SITE_OTHER): Payer: No Typology Code available for payment source | Admitting: Obstetrics & Gynecology

## 2020-03-25 ENCOUNTER — Encounter: Payer: Self-pay | Admitting: Obstetrics & Gynecology

## 2020-03-25 ENCOUNTER — Other Ambulatory Visit: Payer: Self-pay

## 2020-03-25 ENCOUNTER — Other Ambulatory Visit (HOSPITAL_COMMUNITY)
Admission: RE | Admit: 2020-03-25 | Discharge: 2020-03-25 | Disposition: A | Discharge status: Home / Self Care | Payer: No Typology Code available for payment source | Source: Ambulatory Visit | Attending: Obstetrics & Gynecology | Admitting: Obstetrics & Gynecology

## 2020-03-25 VITALS — BP 128/80 | HR 97 | Resp 16 | Ht 67.0 in | Wt 220.0 lb

## 2020-03-25 DIAGNOSIS — L700 Acne vulgaris: Secondary | ICD-10-CM | POA: Diagnosis not present

## 2020-03-25 DIAGNOSIS — Z23 Encounter for immunization: Secondary | ICD-10-CM

## 2020-03-25 DIAGNOSIS — Z01419 Encounter for gynecological examination (general) (routine) without abnormal findings: Secondary | ICD-10-CM | POA: Diagnosis not present

## 2020-03-25 MED ORDER — CLINDAMYCIN PHOS-BENZOYL PEROX 1.2-3.75 % EX GEL: CUTANEOUS | 1 refills | Status: DC

## 2020-03-25 NOTE — Progress Notes (Signed)
Subjective:     Holly Washington is a 39 y.o. female here for a routine exam.  Current complaints: none--BTL and ablation.  RN at [employer].  Incrased acne with masks.  Retin A not helping.    Gynecologic History Patient's last menstrual period was 03/15/2020. Contraception: tubal ligation Last Pap: 2019. Results were: normal Last mammogram: 2021. Results were: normal  Obstetric History OB History  Gravida Para Term Preterm AB Living  2 2 2     2   SAB TAB Ectopic Multiple Live Births               # Outcome Date GA Lbr Len/2nd Weight Sex Delivery Anes PTL Lv  2 Term     F CS-Unspec     1 Term     M CS-Unspec        The following portions of the patient's history were reviewed and updated as appropriate: allergies, current medications, past family history, past medical history, past social history, past surgical history and problem list.  Review of Systems Pertinent items noted in HPI and remainder of comprehensive ROS otherwise negative.    Objective:      Vitals:   03/25/20 1319  BP: 128/80  Pulse: 97  Resp: 16  Weight: 220 lb (99.8 kg)  Height: 5\' 7"  (1.702 m)   Vitals:  WNL General appearance: alert, cooperative and no distress  HEENT: Normocephalic, without obvious abnormality, atraumatic Eyes: negative Throat: lips, mucosa, and tongue normal; teeth and gums normal  Respiratory: Clear to auscultation bilaterally  CV: Regular rate and rhythm  Breasts:  Normal appearance, no masses or tenderness, no nipple retraction or dimpling  GI: Soft, non-tender; bowel sounds normal; no masses,  no organomegaly  GU: External Genitalia:  Tanner V, no lesion Urethra:  No prolapse   Vagina: Pink, normal rugae, no blood or discharge  Cervix: No CMT, Black dot at 11 o'clock (see picture)  Uterus:  Normal size and contour, non tender  Adnexa: Normal, no masses, non tender  Musculoskeletal: No edema, redness or tenderness in the calves or thighs  Skin: No lesions or rash    Lymphatic: Axillary adenopathy: none     Psychiatric: Normal mood and behavior          Assessment:    Healthy female exam.    Plan:    pap with costesting.  Yearly mammograms Benzoyl peroxide / clinda gel for acne once a day.

## 2020-03-27 LAB — CYTOLOGY - PAP
Comment: NEGATIVE
Diagnosis: NEGATIVE
High risk HPV: NEGATIVE

## 2020-11-13 ENCOUNTER — Ambulatory Visit: Payer: No Typology Code available for payment source | Admitting: Obstetrics & Gynecology

## 2020-11-19 ENCOUNTER — Encounter: Payer: Self-pay | Admitting: *Deleted

## 2020-11-19 NOTE — Progress Notes (Signed)
HSG scanned

## 2021-01-07 ENCOUNTER — Other Ambulatory Visit: Payer: Self-pay | Admitting: Obstetrics & Gynecology

## 2021-01-07 DIAGNOSIS — Z1231 Encounter for screening mammogram for malignant neoplasm of breast: Secondary | ICD-10-CM

## 2021-02-26 ENCOUNTER — Other Ambulatory Visit: Payer: Self-pay

## 2021-02-26 ENCOUNTER — Ambulatory Visit (INDEPENDENT_AMBULATORY_CARE_PROVIDER_SITE_OTHER): Payer: No Typology Code available for payment source

## 2021-02-26 DIAGNOSIS — Z1231 Encounter for screening mammogram for malignant neoplasm of breast: Secondary | ICD-10-CM | POA: Diagnosis not present

## 2022-01-13 ENCOUNTER — Other Ambulatory Visit: Payer: Self-pay | Admitting: Obstetrics & Gynecology

## 2022-01-13 DIAGNOSIS — Z1231 Encounter for screening mammogram for malignant neoplasm of breast: Secondary | ICD-10-CM

## 2022-03-05 ENCOUNTER — Ambulatory Visit (INDEPENDENT_AMBULATORY_CARE_PROVIDER_SITE_OTHER): Payer: No Typology Code available for payment source

## 2022-03-05 DIAGNOSIS — Z1231 Encounter for screening mammogram for malignant neoplasm of breast: Secondary | ICD-10-CM

## 2022-03-09 ENCOUNTER — Ambulatory Visit (INDEPENDENT_AMBULATORY_CARE_PROVIDER_SITE_OTHER): Payer: No Typology Code available for payment source | Admitting: Obstetrics & Gynecology

## 2022-03-09 ENCOUNTER — Encounter: Payer: Self-pay | Admitting: Obstetrics & Gynecology

## 2022-03-09 VITALS — BP 137/86 | HR 71 | Ht 67.0 in | Wt 216.0 lb

## 2022-03-09 DIAGNOSIS — L709 Acne, unspecified: Secondary | ICD-10-CM | POA: Insufficient documentation

## 2022-03-09 DIAGNOSIS — L989 Disorder of the skin and subcutaneous tissue, unspecified: Secondary | ICD-10-CM | POA: Diagnosis not present

## 2022-03-09 DIAGNOSIS — Z01419 Encounter for gynecological examination (general) (routine) without abnormal findings: Secondary | ICD-10-CM

## 2022-03-09 DIAGNOSIS — G43909 Migraine, unspecified, not intractable, without status migrainosus: Secondary | ICD-10-CM | POA: Diagnosis not present

## 2022-03-09 NOTE — Progress Notes (Signed)
Subjective:     Holly Washington is a 41 y.o. female here for a routine exam.  Current complaints: no complaints--went away with gluten free diet.    Gynecologic History No LMP recorded. Patient has had an ablation. Contraception: BTL Last Mammogram: 03/05/22- normal Last Pap Smear: 03/25/20-  Negative Last Colon Screening; 2006 Seat Belts:   Yes Sun Screen:   Yes Dental Check Up:  Yes Brush & Floss:  Yes   Obstetric History OB History  Gravida Para Term Preterm AB Living  2 2 2     2   SAB IAB Ectopic Multiple Live Births               # Outcome Date GA Lbr Len/2nd Weight Sex Delivery Anes PTL Lv  2 Term     F CS-Unspec     1 Term     M CS-Unspec        The following portions of the patient's history were reviewed and updated as appropriate: allergies, current medications, past family history, past medical history, past social history, past surgical history, and problem list.  Review of Systems Pertinent items noted in HPI and remainder of comprehensive ROS otherwise negative.    Objective:  Vitals:  WNL General appearance: alert, cooperative and no distress  HEENT: Normocephalic, without obvious abnormality, atraumatic Eyes: negative Throat: lips, mucosa, and tongue normal; teeth and gums normal  Respiratory: Clear to auscultation bilaterally  CV: Regular rate and rhythm  Breasts:  Normal appearance, no masses or tenderness, no nipple retraction or dimpling  GI: Soft, non-tender; bowel sounds normal; no masses,  no organomegaly  GU: External Genitalia:  Tanner V, no lesion Urethra:  No prolapse   Vagina: Pink, normal rugae, no blood or discharge  Cervix: No CMT, no lesion  Uterus:  Normal size and contour, non tender  Adnexa: Normal, no masses, non tender  Musculoskeletal: No edema, redness or tenderness in the calves or thighs; scaley skin lesion on left anterior lower leg  Skin: No lesions or rash  Lymphatic: Axillary adenopathy: none     Psychiatric: Normal  mood and behavior   Vitals:   03/09/22 0810 03/09/22 0843  BP: (!) 135/90 137/86  Pulse: 79 71  Weight: 216 lb (98 kg)   Height: 5\' 7"  (1.702 m)         Assessment:    Healthy female exam.    Plan:   Yearly mammogram Skin lesion--referral to dermatology; eval for adult acne Pap due next year

## 2022-03-09 NOTE — Progress Notes (Signed)
Last Mammogram: 03/05/22- normal Last Pap Smear: 03/25/20-  Negative Last Colon Screening; 2006 Seat Belts:   Yes Sun Screen:   Yes Dental Check Up:  Yes Brush & Floss:  Yes

## 2022-05-14 IMAGING — MG DIGITAL SCREENING BREAST BILAT IMPLANT W/ TOMO W/ CAD
8 of 12 series · 8 of 28 positions shown · non-contrast
Comparison: Previous exam(s).

CLINICAL DATA: Screening.

EXAM:
DIGITAL SCREENING BILATERAL MAMMOGRAM WITH IMPLANTS, CAD AND
TOMOSYNTHESIS
TECHNIQUE: Bilateral screening digital craniocaudal and mediolateral oblique
mammograms were obtained. Bilateral screening digital breast
tomosynthesis was performed. The images were evaluated with
computer-aided detection. Standard and/or implant displaced views
were performed.

[L MLO]
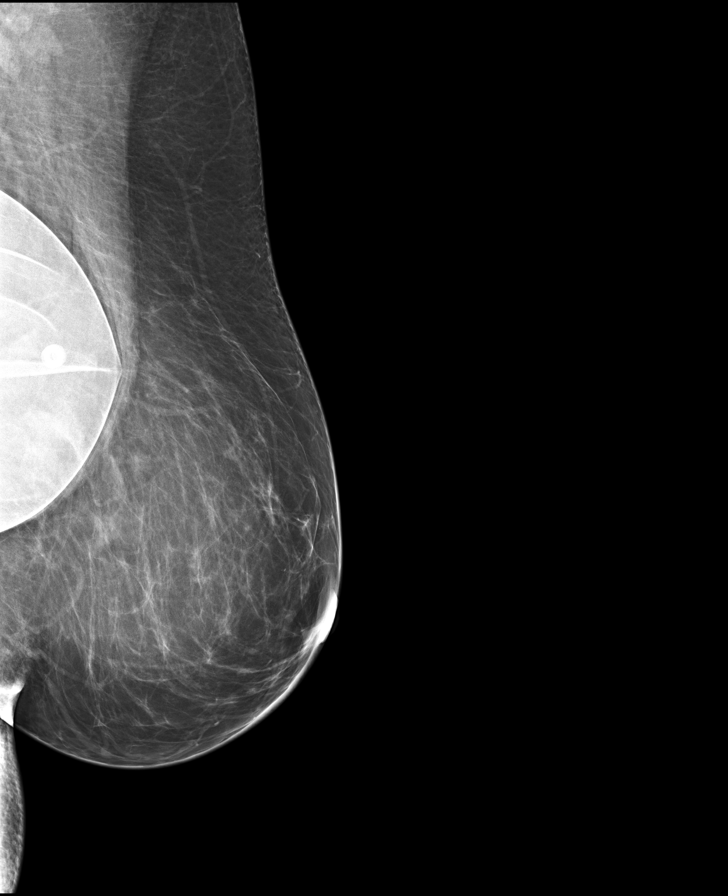

[R CC]
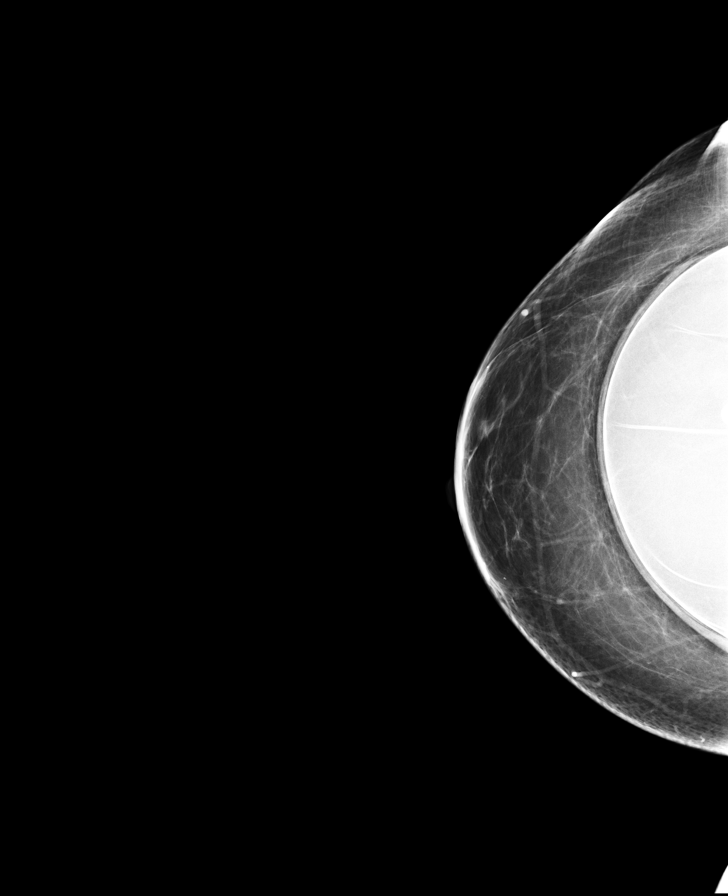

[L CC]
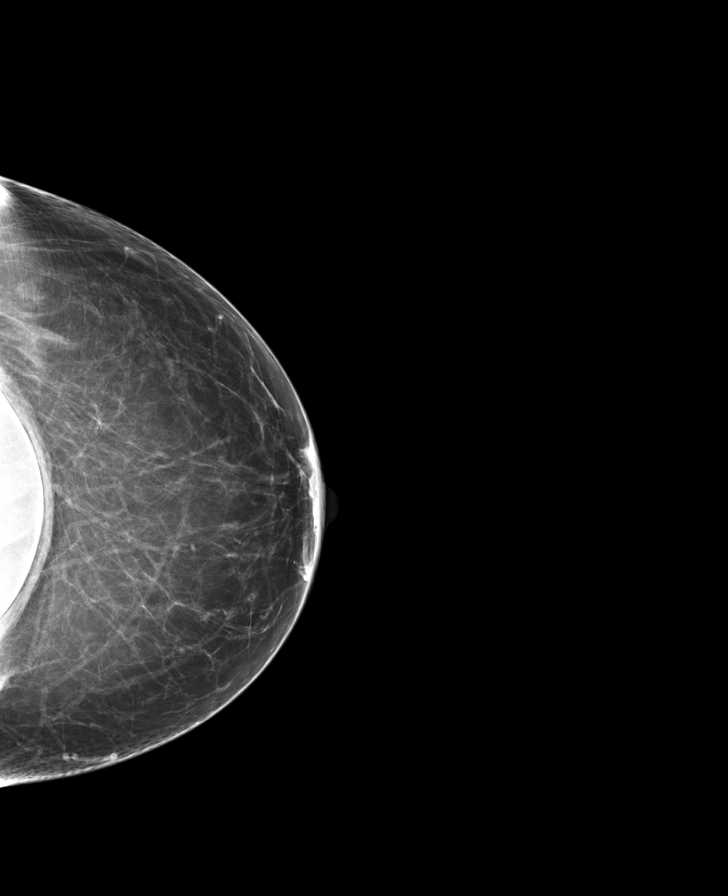

[R MLO]
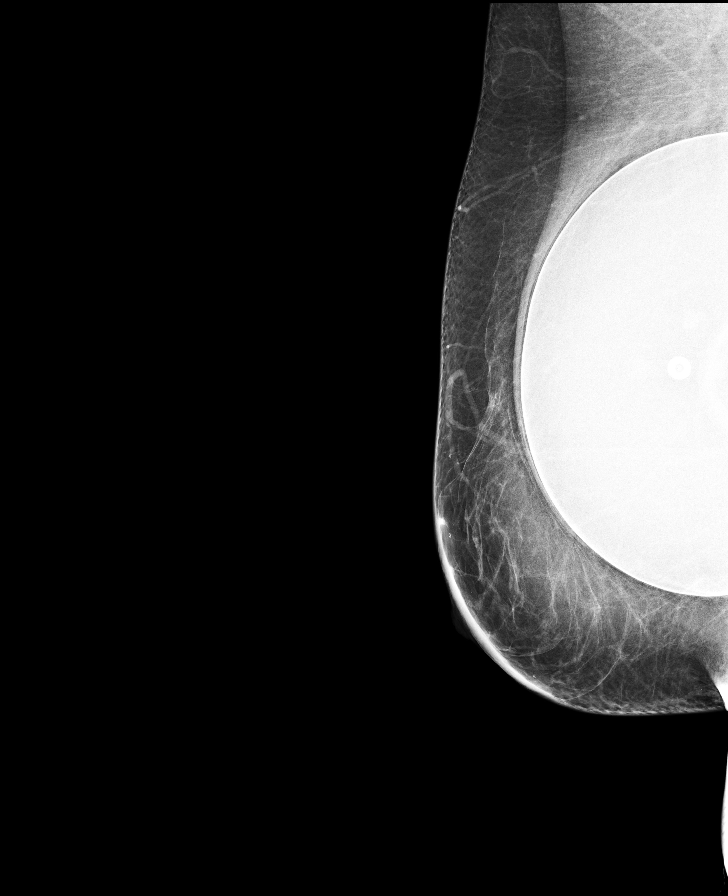

[R MLO synth-2D]
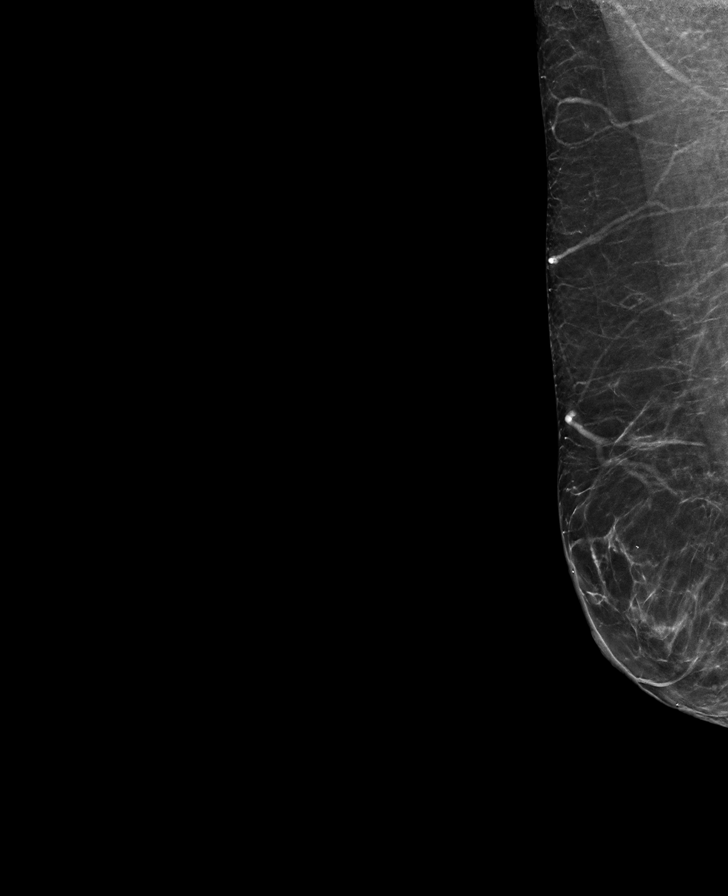

[L CC synth-2D]
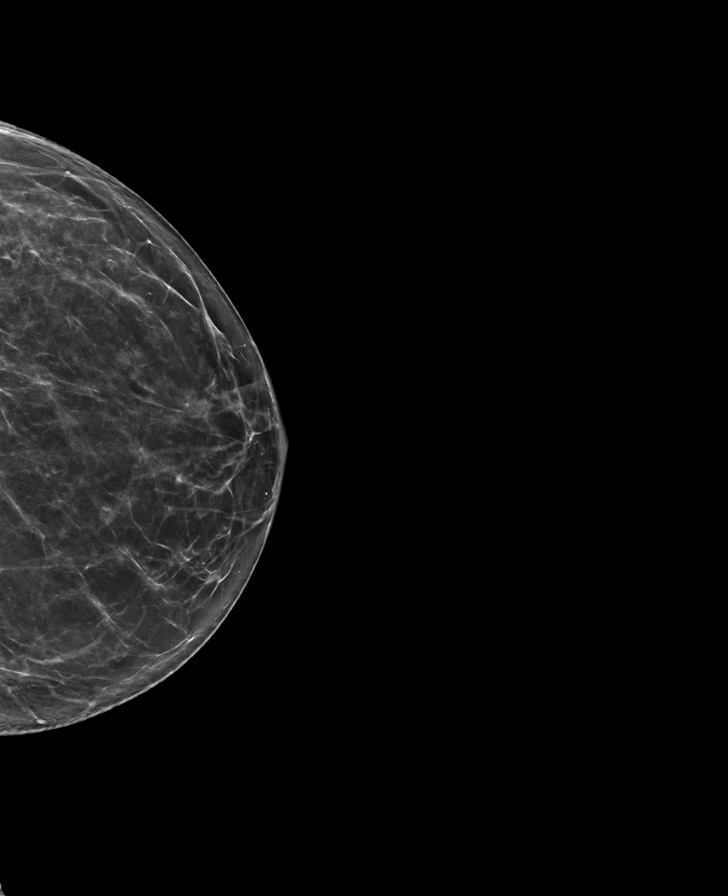

[R CC synth-2D]
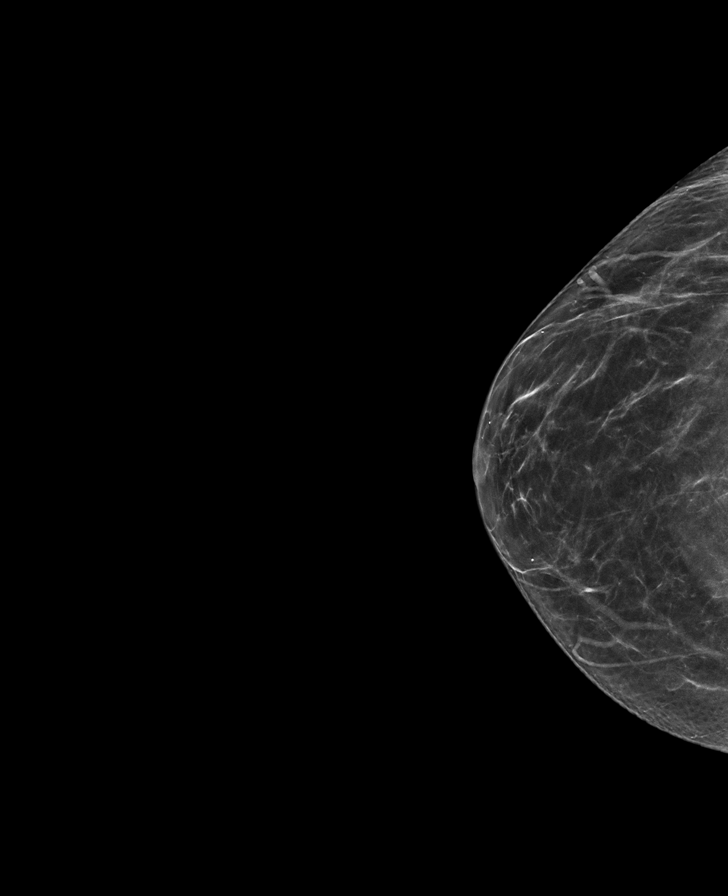

[L MLO synth-2D]
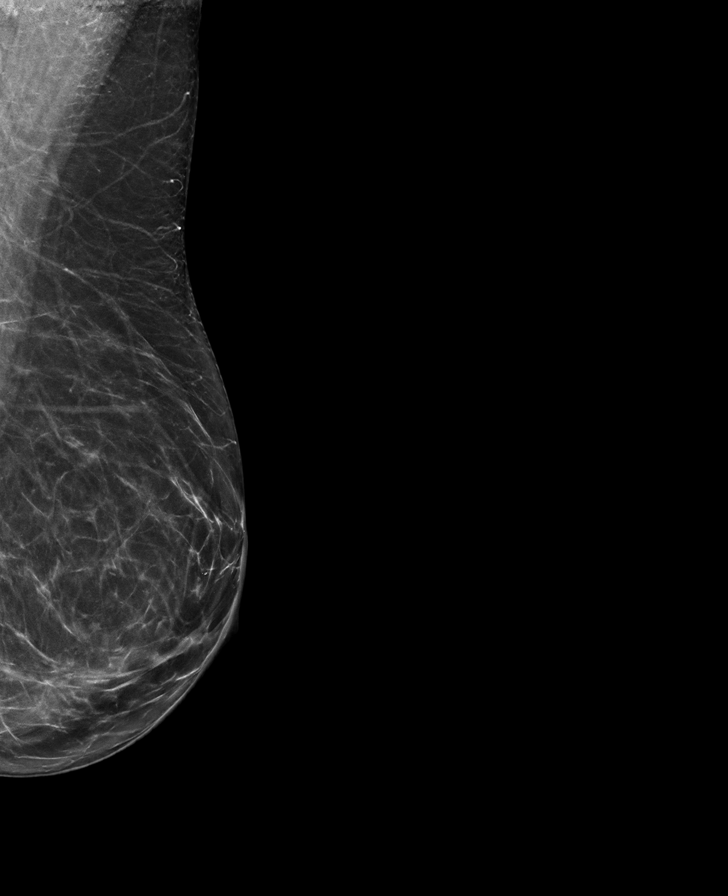

[8 of 28 positions shown; findings below may reference images not displayed]

ACR Breast Density Category b: There are scattered areas of
fibroglandular density.
FINDINGS: The patient has retropectoral implants. There are no findings
suspicious for malignancy.
IMPRESSION: No mammographic evidence of malignancy. A result letter of this
screening mammogram will be mailed directly to the patient.

RECOMMENDATION:
Screening mammogram in one year. (Code:SE-S-JMG)

BI-RADS CATEGORY  1:  Negative.

## 2023-02-11 ENCOUNTER — Other Ambulatory Visit: Payer: Self-pay | Admitting: Obstetrics & Gynecology

## 2023-02-11 DIAGNOSIS — Z1231 Encounter for screening mammogram for malignant neoplasm of breast: Secondary | ICD-10-CM

## 2023-02-22 ENCOUNTER — Telehealth: Payer: Self-pay | Admitting: *Deleted

## 2023-02-22 NOTE — Telephone Encounter (Signed)
Left patient a message to call and schedule annual.

## 2023-03-04 ENCOUNTER — Encounter: Payer: Self-pay | Admitting: Obstetrics & Gynecology

## 2023-03-15 ENCOUNTER — Other Ambulatory Visit (HOSPITAL_COMMUNITY)
Admission: RE | Admit: 2023-03-15 | Discharge: 2023-03-15 | Disposition: A | Discharge status: Home / Self Care | Payer: No Typology Code available for payment source | Source: Ambulatory Visit | Attending: Obstetrics & Gynecology | Admitting: Obstetrics & Gynecology

## 2023-03-15 ENCOUNTER — Encounter: Payer: Self-pay | Admitting: Obstetrics & Gynecology

## 2023-03-15 ENCOUNTER — Ambulatory Visit (INDEPENDENT_AMBULATORY_CARE_PROVIDER_SITE_OTHER): Payer: No Typology Code available for payment source | Admitting: Obstetrics & Gynecology

## 2023-03-15 VITALS — BP 139/82 | HR 78 | Ht 67.0 in | Wt 221.0 lb

## 2023-03-15 DIAGNOSIS — N912 Amenorrhea, unspecified: Secondary | ICD-10-CM

## 2023-03-15 DIAGNOSIS — N911 Secondary amenorrhea: Secondary | ICD-10-CM

## 2023-03-15 DIAGNOSIS — K6389 Other specified diseases of intestine: Secondary | ICD-10-CM | POA: Diagnosis not present

## 2023-03-15 DIAGNOSIS — Z01419 Encounter for gynecological examination (general) (routine) without abnormal findings: Secondary | ICD-10-CM | POA: Diagnosis not present

## 2023-03-15 NOTE — Progress Notes (Signed)
  Subjective:     Holly Washington is a 42 y.o. female here for a routine exam.  Current complaints: amenorrhea since August.  RLQ pain with eval at [facility]--bowel edema on CT.   Gynecologic History Patient's last menstrual period was 12/23/2022 (approximate). Contraception: tubal ligation Last Mammogram: 03/05/22- negative. Scheduled for 03/17/23 Last Pap Smear:  03/25/20- negative Last Colon Screening;  never done Seat Belts:   yes Sun Screen:   yes Dental Check Up:  yes Brush & Floss:  yes   Obstetric History OB History  Gravida Para Term Preterm AB Living  2 2 2     2   SAB IAB Ectopic Multiple Live Births               # Outcome Date GA Lbr Len/2nd Weight Sex Type Anes PTL Lv  2 Term     F CS-Unspec     1 Term     M CS-Unspec        The following portions of the patient's history were reviewed and updated as appropriate: allergies, current medications, past family history, past medical history, past social history, past surgical history, and problem list.  Review of Systems Pertinent items noted in HPI and remainder of comprehensive ROS otherwise negative.    Objective:     Vitals:   03/15/23 0949 03/15/23 1033  BP: (!) 157/85 139/82  Pulse: 70 78  Weight: 221 lb (100.2 kg)   Height: 5\' 7"  (1.702 m)     Vitals:  WNL General appearance: alert, cooperative and no distress  HEENT: Normocephalic, without obvious abnormality, atraumatic Eyes: negative Throat: lips, mucosa, and tongue normal; teeth and gums normal  Respiratory: Clear to auscultation bilaterally  CV: Regular rate and rhythm  Breasts:  Normal appearance, no masses or tenderness, no nipple retraction or dimpling  GI: Soft, non-tender; bowel sounds normal; no masses,  no organomegaly  GU: External Genitalia:  Tanner V, no lesion Urethra:  No prolapse   Vagina: Pink, normal rugae, no blood or discharge  Cervix: No CMT, no lesion  Uterus:  Normal size and contour, non tender  Adnexa: Normal, no  masses, non tender  Musculoskeletal: No edema, redness or tenderness in the calves or thighs  Skin: No lesions or rash  Lymphatic: Axillary adenopathy: none     Psychiatric: Normal mood and behavior        Assessment:    Healthy female exam.  RLQ pain  Plan:    Pap with cotesting; cultures for RLQ pain. HTN--2nd BP mild HTN--will f/u with PCP RLQ pain--CT showed bwel wall edema and free fluid although appendix was normal.  Patient not have any GI symptoms.  Pain comes and goes pain is present today.  She has no left lower quadrant pain where the Filshie clips were noticed on CT.  Patient to follow-up with primary care physician for GI workup.  I also placed a referral to GI in case she needs that in the future.  There is a long wait for appointments so I rather her be in the queue.  At this point we are not going to remove the Filshie clips but could be something we do in the future if pain develop in left lower quadrant.  Amenorrhea secondary--TSH, prolactin, FSH. E2

## 2023-03-17 ENCOUNTER — Ambulatory Visit: Payer: No Typology Code available for payment source

## 2023-03-17 DIAGNOSIS — Z1231 Encounter for screening mammogram for malignant neoplasm of breast: Secondary | ICD-10-CM

## 2023-03-17 LAB — TSH: TSH: 1.17 u[IU]/mL (ref 0.450–4.500)

## 2023-03-17 LAB — ESTRADIOL: Estradiol: 24.3 pg/mL

## 2023-03-17 LAB — FOLLICLE STIMULATING HORMONE: FSH: 10 m[IU]/mL

## 2023-03-17 LAB — PROLACTIN: Prolactin: 6.4 ng/mL (ref 4.8–33.4)

## 2023-03-18 LAB — CYTOLOGY - PAP
Chlamydia: NEGATIVE
Comment: NEGATIVE
Comment: NEGATIVE
Comment: NORMAL
Diagnosis: NEGATIVE
High risk HPV: NEGATIVE
Neisseria Gonorrhea: NEGATIVE

## 2023-03-22 ENCOUNTER — Encounter: Payer: Self-pay | Admitting: Obstetrics & Gynecology

## 2023-03-23 ENCOUNTER — Encounter: Payer: Self-pay | Admitting: Gastroenterology

## 2023-05-25 ENCOUNTER — Encounter: Payer: Self-pay | Admitting: Obstetrics & Gynecology

## 2023-06-15 ENCOUNTER — Ambulatory Visit: Payer: No Typology Code available for payment source | Admitting: Gastroenterology

## 2023-06-28 ENCOUNTER — Encounter: Payer: Self-pay | Admitting: Obstetrics & Gynecology

## 2023-07-22 ENCOUNTER — Ambulatory Visit: Admitting: Obstetrics and Gynecology

## 2023-07-22 ENCOUNTER — Encounter: Payer: Self-pay | Admitting: Obstetrics and Gynecology

## 2023-07-22 VITALS — BP 161/115 | HR 85 | Ht 67.0 in | Wt 221.0 lb

## 2023-07-22 DIAGNOSIS — R102 Pelvic and perineal pain: Secondary | ICD-10-CM

## 2023-07-22 MED ORDER — CYCLOBENZAPRINE HCL 5 MG PO TABS: 5.0000 mg | ORAL_TABLET | Freq: Three times a day (TID) | ORAL | 1 refills | Status: DC | PRN

## 2023-07-22 NOTE — Progress Notes (Signed)
 GYNECOLOGY OFFICE VISIT NOTE  History:   Holly Washington is a 43 y.o. [phone] here today for acute onset pelvic pain.   Discussed the use of AI scribe software for clinical note transcription with the patient, who gave verbal consent to proceed.  History of Present Illness   The patient presents with right-sided lower abdominal pain radiating into the leg and sometimes into her groin.   She experiences right-sided groin pain radiating into her leg, which has been intermittent but became acute today. The pain does not worsen with pressure on the abdomen but is exacerbated by certain movements. She describes the pain as 'frustrating' and 'more than anything' in terms of its impact on her life. No changes in bowel or bladder function are noted.  She has not been consistent with taking medications for pain relief, and neither ibuprofen nor diclofenac have been effective. She has not tried muscle relaxants or other prescription anti-inflammatories recently.  She has undergone previous imaging studies, namely multiple CT studies which have not revealed any pathology.   She has not seen a general surgeon for this issue yet.  Her past medical history includes a uterine ablation and tubal ligation, which were performed at separate times. Her periods initially persisted after the ablation but recently ceased -  although she occasionally experiences symptoms that coincide with her menstrual cycle.        Past Medical History:  Diagnosis Date   ANA positive    Anxiety    Headache(784.0)    topomax and maxalt prn   Thyroid disease     Past Surgical History:  Procedure Laterality Date   AUGMENTATION MAMMAPLASTY Bilateral 10/2006   c/s x 2  3/05, 11/07   colonscopy  01/2005   DILATION AND CURETTAGE OF UTERUS  09/2010   LAPAROSCOPIC TUBAL LIGATION  11/27/2010   Procedure: LAPAROSCOPIC TUBAL LIGATION;  Surgeon: Hollie Salk C. Marice Potter, MD;  Location: [facility];  Service: Gynecology;  Laterality:  Bilateral;   LASIK  05/2009   NOVASURE ABLATION  11/27/2010   Procedure: NOVASURE ABLATION;  Surgeon: Hollie Salk C. Marice Potter, MD;  Location: [facility];  Service: Gynecology;  Laterality: N/A;   WISDOM TOOTH EXTRACTION      The following portions of the patient's history were reviewed and updated as appropriate: allergies, current medications, past family history, past medical history, past social history, past surgical history and problem list.   Health Maintenance:   Diagnosis  Date Value Ref Range Status  03/15/2023   Final   - Negative for intraepithelial lesion or malignancy (NILM)    Review of Systems:  Pertinent items noted in HPI and remainder of comprehensive ROS otherwise negative.  Physical Exam:  BP (!) 161/115   Pulse 85   Ht 5\' 7"  (1.702 m)   Wt 221 lb (100.2 kg)   BMI 34.61 kg/m  CONSTITUTIONAL: Well-developed, well-nourished female in no acute distress.  HEENT:  Normocephalic, atraumatic. External right and left ear normal. No scleral icterus.  NECK: Normal range of motion, supple, no masses noted on observation SKIN: No rash noted. Not diaphoretic. No erythema. No pallor. MUSCULOSKELETAL: Normal range of motion. No edema noted. NEUROLOGIC: Alert and oriented to person, place, and time. Normal muscle tone coordination. No cranial nerve deficit noted. PSYCHIATRIC: Normal mood and affect. Normal behavior. Normal judgment and thought content.  PELVIC: Normal appearing external genitalia; normal urethral meatus; normal appearing vaginal mucosa. No CMT. No bladder tenderness. No levator tenderness. She does have right sided obturator tenderness  with spasm. This replicated her pain. Her uterus was nontender.  Performed in the presence of a chaperone  Assessment and Plan:   1. Pelvic pain (Primary) Acute right lower abdominal pain, intermittent and radiating down the leg. Differential diagnosis includes musculoskeletal issues, such as obturator muscle spasm, endometriosis, hernia, or  post-ablation tubal syndrome. The obturator muscle is tight and tender, suggesting a musculoskeletal component. Endometriosis is less likely due to the acute nature of the pain. A hernia is possible given the radiation of pain down the leg. 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 Informed consent discussed regarding Flexeril and Robaxin, including potential side effects such as sleepiness. Ultrasound and surgical evaluation are planned to rule out other causes. If inconclusive, laparoscopy may be considered. If post-ablation tubal syndrome is confirmed, options include cervical dilation or minor surgical intervention to allow blood drainage. Discussed the possibility of hysterectomy if other interventions are ineffective, though she prefers to avoid this option. She also prefers to avoid birth control as that is why she had the other surgeries to begin with. If PATS, could also consider GnRH antagonists.  - Prescribe Flexeril for muscle relaxation, starting with 5 mg and increasing up to 15 mg three times a day as needed. - Order pelvic ultrasound to evaluate for endometriosis, post-ablation tubal syndrome, or other causes of pain. - Refer to a general surgeon to evaluate for possible hernia. - Consider Robaxin if Flexeril is ineffective or too many side effects.  - If ultrasound and surgical evaluation are inconclusive, consider laparoscopy to further investigate the cause of pain. - If post-ablation tubal syndrome is confirmed, consider cervical dilation or minor surgical intervention to allow blood drainage.     - US PELVIC COMPLETE WITH TRANSVAGINAL; Future - Ambulatory referral to General Surgery - cyclobenzaprine (FLEXERIL) 5 MG tablet; Take 1 tablet (5 mg total) by mouth 3 (three) times daily as needed for muscle spasms.  Dispense: 30 tablet; Refill: 1    Meds ordered this encounter  Medications   cyclobenzaprine (FLEXERIL) 5 MG tablet    Sig: Take 1  tablet (5 mg total) by mouth 3 (three) times daily as needed for muscle spasms.    Dispense:  30 tablet    Refill:  1     Routine preventative health maintenance measures emphasized. Please refer to After Visit Summary for other counseling recommendations.   Return if symptoms worsen or fail to improve.  Milas Hock, MD, FACOG Obstetrician & Gynecologist, [facility] for [facility], [facility]

## 2023-07-23 ENCOUNTER — Ambulatory Visit (INDEPENDENT_AMBULATORY_CARE_PROVIDER_SITE_OTHER)

## 2023-07-23 ENCOUNTER — Encounter: Payer: Self-pay | Admitting: Obstetrics and Gynecology

## 2023-07-23 DIAGNOSIS — R102 Pelvic and perineal pain: Secondary | ICD-10-CM | POA: Diagnosis not present

## 2023-07-31 NOTE — H&P (View-Only) (Signed)
 GYNECOLOGY OFFICE VISIT NOTE  History:   Holly Washington is a 43 y.o. [phone] here today for intermittent pelvic pain with a frequency that would mimic a menstrual cycle. She has secondary amenorrhea s/p endometrial ablation (previously has menses after ablation) and her FSH is still premenopausal range. She has a h/o tubal ligation with filshie clips in 2012 with ablation at that time.     She is here today for attempted cervical dilation to see if stenotic and if there is then relief of pain. Next expected cycle of pain would be 4/3.     The following portions of the patient's history were reviewed and updated as appropriate: allergies, current medications, past family history, past medical history, past social history, past surgical history and problem list.    Review of Systems:  Pertinent items noted in HPI and remainder of comprehensive ROS otherwise negative.  Physical Exam:  BP 132/87   Pulse 78   Ht 5\' 7"  (1.702 m)   Wt 221 lb (100.2 kg)   BMI 34.61 kg/m  CONSTITUTIONAL: Well-developed, well-nourished female in no acute distress.  HEENT:  Normocephalic, atraumatic. External right and left ear normal. No scleral icterus.  NECK: Normal range of motion, supple, no masses noted on observation SKIN: No rash noted. Not diaphoretic. No erythema. No pallor. MUSCULOSKELETAL: Normal range of motion. No edema noted. NEUROLOGIC: Alert and oriented to person, place, and time. Normal muscle tone coordination. No cranial nerve deficit noted. PSYCHIATRIC: Normal mood and affect. Normal behavior. Normal judgment and thought content.  Labs and Imaging No results found for this or any previous visit (from the past week). US PELVIC COMPLETE WITH TRANSVAGINAL Result Date: 07/23/2023 CLINICAL DATA:  Pelvic pain EXAM: TRANSABDOMINAL AND TRANSVAGINAL ULTRASOUND OF PELVIS TECHNIQUE: Both transabdominal and transvaginal ultrasound examinations of the pelvis were performed. Transabdominal  technique was performed for global imaging of the pelvis including uterus, ovaries, adnexal regions, and pelvic cul-de-sac. It was necessary to proceed with endovaginal exam following the transabdominal exam to visualize the uterus endometrium adnexa. COMPARISON:  CT 06/03/2023 FINDINGS: Uterus Measurements: 8.7 x 3.8 x 5 cm = volume: 87.5 mL. No fibroids or other mass visualized. Endometrium Thickness: 4.5 mm.  No focal abnormality visualized. Right ovary Not well seen. Probable small right adnexal cyst measuring 19 x 12 by 19 mm on transabdominal images only. No follow up imaging recommended. Note: This recommendation does not apply to premenarchal patients or to those with increased risk (genetic, family history, elevated tumor markers or other high-risk factors) of ovarian cancer. Reference: Radiology 2019 Nov; 293(2):359-371. Left ovary Measurements: 2.4 x 1.5 x 1.6 cm = volume: 2.9 mL. Normal appearance/no adnexal mass. Other findings No abnormal free fluid. IMPRESSION: Negative pelvic ultrasound. Right ovary not well seen. Electronically Signed   By: Jasmine Pang M.D.   On: 07/23/2023 15:31    Gynecologic Procedure Note  Description of the procedure: Preop antibiotics not indicated. Informed consent reviewed and signed. Pt given opportunity to ask questions. Timeout performed.   Pt placed in the dorsal lithotomy fashion.   Open sided speculum placed into the patient's vagina. Cervix prepped with betadine. Cervix numbed with 10 cc of 1% lidocaine with epinephrine in an intracervical fashion. Single tooth tenaculum applied to the 12 o'clock position of the cervix.   Cervix progressively dilated to a 22 Pratt.  Some old blood drained with dilation.   Patient tolerated the procedure well.    Assessment and Plan:   1. Secondary amenorrhea (Primary)  See below  2. 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  Cervical dilation attempted today and successful.   Plan is still  for f/u GS to ensure no hernia component to pain. Has appointment on Monday.  She will let me know the following week if she has pain relief from procedure. If she does not, then the plan is to have surgery on 4/22 as scheduled.    No orders of the defined types were placed in this encounter.    Routine preventative health maintenance measures emphasized. Please refer to After Visit Summary for other counseling recommendations.   No follow-ups on file.  Milas Hock, MD, FACOG Obstetrician & Gynecologist, [facility] for [facility], [facility]

## 2023-07-31 NOTE — Progress Notes (Unsigned)
 GYNECOLOGY OFFICE VISIT NOTE  History:   Holly Washington is a 43 y.o. [phone] here today for intermittent pelvic pain with a frequency that would mimic a menstrual cycle. She has secondary amenorrhea s/p endometrial ablation (previously has menses after ablation) and her FSH is still premenopausal range. She has a h/o tubal ligation with filshie clips in 2012 with ablation at that time.     She is here today for attempted cervical dilation to see if stenotic and if there is then relief of pain. Next expected cycle of pain would be 4/3.     The following portions of the patient's history were reviewed and updated as appropriate: allergies, current medications, past family history, past medical history, past social history, past surgical history and problem list.    Review of Systems:  Pertinent items noted in HPI and remainder of comprehensive ROS otherwise negative.  Physical Exam:  BP 132/87   Pulse 78   Ht 5\' 7"  (1.702 m)   Wt 221 lb (100.2 kg)   BMI 34.61 kg/m  CONSTITUTIONAL: Well-developed, well-nourished female in no acute distress.  HEENT:  Normocephalic, atraumatic. External right and left ear normal. No scleral icterus.  NECK: Normal range of motion, supple, no masses noted on observation SKIN: No rash noted. Not diaphoretic. No erythema. No pallor. MUSCULOSKELETAL: Normal range of motion. No edema noted. NEUROLOGIC: Alert and oriented to person, place, and time. Normal muscle tone coordination. No cranial nerve deficit noted. PSYCHIATRIC: Normal mood and affect. Normal behavior. Normal judgment and thought content.  Labs and Imaging No results found for this or any previous visit (from the past week). US PELVIC COMPLETE WITH TRANSVAGINAL Result Date: 07/23/2023 CLINICAL DATA:  Pelvic pain EXAM: TRANSABDOMINAL AND TRANSVAGINAL ULTRASOUND OF PELVIS TECHNIQUE: Both transabdominal and transvaginal ultrasound examinations of the pelvis were performed. Transabdominal  technique was performed for global imaging of the pelvis including uterus, ovaries, adnexal regions, and pelvic cul-de-sac. It was necessary to proceed with endovaginal exam following the transabdominal exam to visualize the uterus endometrium adnexa. COMPARISON:  CT 06/03/2023 FINDINGS: Uterus Measurements: 8.7 x 3.8 x 5 cm = volume: 87.5 mL. No fibroids or other mass visualized. Endometrium Thickness: 4.5 mm.  No focal abnormality visualized. Right ovary Not well seen. Probable small right adnexal cyst measuring 19 x 12 by 19 mm on transabdominal images only. No follow up imaging recommended. Note: This recommendation does not apply to premenarchal patients or to those with increased risk (genetic, family history, elevated tumor markers or other high-risk factors) of ovarian cancer. Reference: Radiology 2019 Nov; 293(2):359-371. Left ovary Measurements: 2.4 x 1.5 x 1.6 cm = volume: 2.9 mL. Normal appearance/no adnexal mass. Other findings No abnormal free fluid. IMPRESSION: Negative pelvic ultrasound. Right ovary not well seen. Electronically Signed   By: Jasmine Pang M.D.   On: 07/23/2023 15:31    Gynecologic Procedure Note  Description of the procedure: Preop antibiotics not indicated. Informed consent reviewed and signed. Pt given opportunity to ask questions. Timeout performed.   Pt placed in the dorsal lithotomy fashion.   Open sided speculum placed into the patient's vagina. Cervix prepped with betadine. Cervix numbed with 10 cc of 1% lidocaine with epinephrine in an intracervical fashion. Single tooth tenaculum applied to the 12 o'clock position of the cervix.   Cervix progressively dilated to a 22 Pratt.  Some old blood drained with dilation.   Patient tolerated the procedure well.    Assessment and Plan:   1. Secondary amenorrhea (Primary)  See below  2. 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  Cervical dilation attempted today and successful.   Plan is still  for f/u GS to ensure no hernia component to pain. Has appointment on Monday.  She will let me know the following week if she has pain relief from procedure. If she does not, then the plan is to have surgery on 4/22 as scheduled.    No orders of the defined types were placed in this encounter.    Routine preventative health maintenance measures emphasized. Please refer to After Visit Summary for other counseling recommendations.   No follow-ups on file.  Milas Hock, MD, FACOG Obstetrician & Gynecologist, [facility] for [facility], [facility]

## 2023-08-03 ENCOUNTER — Telehealth: Payer: Self-pay

## 2023-08-03 NOTE — Telephone Encounter (Signed)
 Called patient to schedule surgery with Dr. Para March. Patient agreed to have surgery on 08/31/23 @[facility] at 9:30 am. I provided surgery details and pre-op instructions over the phone. Written details will be sent to patients Mychart acct.

## 2023-08-04 ENCOUNTER — Encounter: Payer: Self-pay | Admitting: Obstetrics and Gynecology

## 2023-08-04 ENCOUNTER — Ambulatory Visit: Admitting: Obstetrics and Gynecology

## 2023-08-04 VITALS — BP 132/87 | HR 78 | Ht 67.0 in | Wt 221.0 lb

## 2023-08-04 DIAGNOSIS — N911 Secondary amenorrhea: Secondary | ICD-10-CM

## 2023-08-04 DIAGNOSIS — R102 Pelvic and perineal pain: Secondary | ICD-10-CM

## 2023-08-09 ENCOUNTER — Encounter: Payer: Self-pay | Admitting: Obstetrics and Gynecology

## 2023-08-17 ENCOUNTER — Telehealth: Payer: Self-pay | Admitting: Obstetrics and Gynecology

## 2023-08-17 NOTE — Telephone Encounter (Signed)
 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 Unfortunately cervical dilation in the office did not help her pain at all.   Prior to cervical dilation in the office, we had planned Dx laparoscopy and D&C, hysteroscopy. Given all of the above, I called Holly Washington and told her I recommended hysterectomy (TLH, BS, cystoscopy and TAP block).   Reviewed risks of the procedure compared to diagnostic laparoscopy.   Reviewed alternatives - trying hormonal therapies to try menstrual suppression I.e. OCPs, Myfembree, etc.   Reviewed recovery and time off following this procedure.   She will consider and let me know via MyChart.   Milas Hock, MD Attending Obstetrician & Gynecologist, [facility] for [facility], [facility]

## 2023-08-20 ENCOUNTER — Telehealth: Payer: Self-pay | Admitting: *Deleted

## 2023-08-20 NOTE — Telephone Encounter (Signed)
 Advised patient that FMLA paperwork is ready for pick up and she will need to pay $15.00 fee.

## 2023-08-25 ENCOUNTER — Encounter: Payer: Self-pay | Admitting: Obstetrics and Gynecology

## 2023-08-26 NOTE — Progress Notes (Signed)
 Surgical Instructions   Your procedure is scheduled on August 31, 2023. Report to [facility] Main Entrance "A" at 7:30 A.M., then check in with the Admitting office. Any questions or running late day of surgery: call [phone]  Questions prior to your surgery date: call [phone], Monday-Friday, 8am-4pm. If you experience any cold or flu symptoms such as cough, fever, chills, shortness of breath, etc. between now and your scheduled surgery, please notify us at the above number.     Remember:  Do not eat or drink after midnight the night before your surgery      Take these medicines the morning of surgery with A SIP OF WATER  buPROPion (WELLBUTRIN XL)    May take these medicines IF NEEDED:    One week prior to surgery, STOP taking any Aspirin (unless otherwise instructed by your surgeon) Aleve, Naproxen, Ibuprofen, Motrin, Advil, Goody's, BC's, all herbal medications, fish oil, and non-prescription vitamins.                     Do NOT Smoke (Tobacco/Vaping) for 24 hours prior to your procedure.  If you use a CPAP at night, you may bring your mask/headgear for your overnight stay.   You will be asked to remove any contacts, glasses, piercing's, hearing aid's, dentures/partials prior to surgery. Please bring cases for these items if needed.    Patients discharged the day of surgery will not be allowed to drive home, and someone needs to stay with them for 24 hours.  SURGICAL WAITING ROOM VISITATION Patients may have no more than 2 support people in the waiting area - these visitors may rotate.   Pre-op nurse will coordinate an appropriate time for 1 ADULT support person, who may not rotate, to accompany patient in pre-op.  Children under the age of 63 must have an adult with them who is not the patient and must remain in the main waiting area with an adult.  If the patient needs to stay at the hospital during part of their recovery, the visitor guidelines for inpatient  rooms apply.  Please refer to the [facility] website for the visitor guidelines for any additional information.   If you received a COVID test during your pre-op visit  it is requested that you wear a mask when out in public, stay away from anyone that may not be feeling well and notify your surgeon if you develop symptoms. If you have been in contact with anyone that has tested positive in the last 10 days please notify you surgeon.      Pre-operative CHG Bathing Instructions   You can play a key role in reducing the risk of infection after surgery. Your skin needs to be as free of germs as possible. You can reduce the number of germs on your skin by washing with CHG (chlorhexidine gluconate) soap before surgery. CHG is an antiseptic soap that kills germs and continues to kill germs even after washing.   DO NOT use if you have an allergy to chlorhexidine/CHG or antibacterial soaps. If your skin becomes reddened or irritated, stop using the CHG and notify one of our RNs at [phone].              TAKE A SHOWER THE NIGHT BEFORE SURGERY AND THE DAY OF SURGERY    Please keep in mind the following:  DO NOT shave, including legs and underarms, 48 hours prior to surgery.   You may shave your face before/day of  surgery.  Place clean sheets on your bed the night before surgery Use a clean washcloth (not used since being washed) for each shower. DO NOT sleep with pet's night before surgery.  CHG Shower Instructions:  Wash your face and private area with normal soap. If you choose to wash your hair, wash first with your normal shampoo.  After you use shampoo/soap, rinse your hair and body thoroughly to remove shampoo/soap residue.  Turn the water OFF and apply half the bottle of CHG soap to a CLEAN washcloth.  Apply CHG soap ONLY FROM YOUR NECK DOWN TO YOUR TOES (washing for 3-5 minutes)  DO NOT use CHG soap on face, private areas, open wounds, or sores.  Pay special attention to the area  where your surgery is being performed.  If you are having back surgery, having someone wash your back for you may be helpful. Wait 2 minutes after CHG soap is applied, then you may rinse off the CHG soap.  Pat dry with a clean towel  Put on clean pajamas    Additional instructions for the day of surgery: DO NOT APPLY any lotions, deodorants, cologne, or perfumes.   Do not wear jewelry or makeup Do not wear nail polish, gel polish, artificial nails, or any other type of covering on natural nails (fingers and toes) Do not bring valuables to the hospital. [facility] is not responsible for valuables/personal belongings. Put on clean/comfortable clothes.  Please brush your teeth.  Ask your nurse before applying any prescription medications to the skin.

## 2023-08-30 ENCOUNTER — Encounter (HOSPITAL_COMMUNITY)
Admission: RE | Admit: 2023-08-30 | Discharge: 2023-08-30 | Disposition: A | Discharge status: Home / Self Care | Source: Ambulatory Visit | Attending: Obstetrics and Gynecology | Admitting: Obstetrics and Gynecology

## 2023-08-30 ENCOUNTER — Other Ambulatory Visit: Payer: Self-pay

## 2023-08-30 ENCOUNTER — Encounter (HOSPITAL_COMMUNITY): Payer: Self-pay

## 2023-08-30 ENCOUNTER — Telehealth: Payer: Self-pay | Admitting: Obstetrics and Gynecology

## 2023-08-30 VITALS — BP 153/96 | HR 96 | Temp 98.0°F | Resp 18 | Ht 67.0 in | Wt 220.8 lb

## 2023-08-30 DIAGNOSIS — N911 Secondary amenorrhea: Secondary | ICD-10-CM | POA: Insufficient documentation

## 2023-08-30 DIAGNOSIS — Z01812 Encounter for preprocedural laboratory examination: Secondary | ICD-10-CM | POA: Insufficient documentation

## 2023-08-30 DIAGNOSIS — Z01818 Encounter for other preprocedural examination: Secondary | ICD-10-CM | POA: Diagnosis present

## 2023-08-30 LAB — CBC
HCT: 42.7 % (ref 36.0–46.0)
Hemoglobin: 14.7 g/dL (ref 12.0–15.0)
MCH: 30.6 pg (ref 26.0–34.0)
MCHC: 34.4 g/dL (ref 30.0–36.0)
MCV: 88.8 fL (ref 80.0–100.0)
Platelets: 237 10*3/uL (ref 150–400)
RBC: 4.81 MIL/uL (ref 3.87–5.11)
RDW: 12.4 % (ref 11.5–15.5)
WBC: 5.1 10*3/uL (ref 4.0–10.5)
nRBC: 0 % (ref 0.0–0.2)

## 2023-08-30 LAB — TYPE AND SCREEN
ABO/RH(D): O POS
Antibody Screen: NEGATIVE

## 2023-08-30 NOTE — Progress Notes (Signed)
 PCP - Earlis Glimpse Cardiologist - denies  PPM/ICD - denies Device Orders -  Rep Notified -   Chest x-ray - na EKG - na Stress Test - denies ECHO - denies Cardiac Cath - denies  Sleep Study - denies CPAP -   Fasting Blood Sugar - na Checks Blood Sugar _____ times a day  Last dose of GLP1 agonist-  na GLP1 instructions:   Blood Thinner Instructions:na Aspirin Instructions:na  ERAS Protcol -no PRE-SURGERY Ensure or G2-   COVID TEST- na   Anesthesia review: no  Patient denies shortness of breath, fever, cough and chest pain at PAT appointment   All instructions explained to the patient, with a verbal understanding of the material. Patient agrees to go over the instructions while at home for a better understanding. Patient also instructed to wear a mask when out in public prior to surgery. The opportunity to ask questions was provided.

## 2023-08-30 NOTE — Telephone Encounter (Signed)
 I was notified at end of day the this patients surgery prior auth was still pending.  I did contact insurance company and request the review be escalated since her surgery is tomorrow.  The nurse is to call me back first thing in the morning.  Patient was notifed and is not happy to find out the may not be approved when she is to be at the hospital at 7am.   Patient will plan to still come for surgery prep, we will follow up with insurance again at 8am.

## 2023-08-31 ENCOUNTER — Ambulatory Visit (HOSPITAL_COMMUNITY)

## 2023-08-31 ENCOUNTER — Encounter (HOSPITAL_COMMUNITY): Payer: Self-pay | Admitting: Obstetrics and Gynecology

## 2023-08-31 ENCOUNTER — Ambulatory Visit (HOSPITAL_BASED_OUTPATIENT_CLINIC_OR_DEPARTMENT_OTHER)

## 2023-08-31 ENCOUNTER — Other Ambulatory Visit: Payer: Self-pay

## 2023-08-31 ENCOUNTER — Encounter (HOSPITAL_COMMUNITY)
Admission: RE | Disposition: A | Discharge status: Home / Self Care | Payer: Self-pay | Source: Home / Self Care | Attending: Obstetrics and Gynecology

## 2023-08-31 ENCOUNTER — Ambulatory Visit (HOSPITAL_COMMUNITY)
Admission: RE | Admit: 2023-08-31 | Discharge: 2023-08-31 | Disposition: A | Discharge status: Home / Self Care | Attending: Obstetrics and Gynecology | Admitting: Obstetrics and Gynecology

## 2023-08-31 DIAGNOSIS — N8003 Adenomyosis of the uterus: Secondary | ICD-10-CM | POA: Insufficient documentation

## 2023-08-31 DIAGNOSIS — N911 Secondary amenorrhea: Secondary | ICD-10-CM | POA: Diagnosis not present

## 2023-08-31 DIAGNOSIS — R102 Pelvic and perineal pain: Secondary | ICD-10-CM

## 2023-08-31 DIAGNOSIS — N9989 Other postprocedural complications and disorders of genitourinary system: Secondary | ICD-10-CM

## 2023-08-31 HISTORY — PX: TOTAL LAPAROSCOPIC HYSTERECTOMY WITH SALPINGECTOMY: SHX6742

## 2023-08-31 HISTORY — PX: CYSTOSCOPY: SHX5120

## 2023-08-31 LAB — POCT PREGNANCY, URINE: Preg Test, Ur: NEGATIVE

## 2023-08-31 SURGERY — HYSTERECTOMY, TOTAL, LAPAROSCOPIC, WITH SALPINGECTOMY
Anesthesia: General | Site: Bladder

## 2023-08-31 MED ORDER — CEFAZOLIN (ANCEF) 1 G IV SOLR: 2.0000 g | INTRAVENOUS | Status: DC

## 2023-08-31 MED ORDER — DEXAMETHASONE SODIUM PHOSPHATE 10 MG/ML IJ SOLN
INTRAMUSCULAR | Status: AC
  Filled 2023-08-31: qty 1

## 2023-08-31 MED ORDER — SODIUM CHLORIDE 0.9 % IR SOLN
Status: DC | PRN
  Administered 2023-08-31: 1000 mL
  Administered 2023-08-31: 500 mL via INTRAVESICAL

## 2023-08-31 MED ORDER — ACETAMINOPHEN 10 MG/ML IV SOLN: 1000.0000 mg | Freq: Once | INTRAVENOUS | Status: DC | PRN

## 2023-08-31 MED ORDER — ROCURONIUM BROMIDE 10 MG/ML (PF) SYRINGE
PREFILLED_SYRINGE | INTRAVENOUS | Status: AC
  Filled 2023-08-31: qty 10

## 2023-08-31 MED ORDER — HYDROMORPHONE HCL 1 MG/ML IJ SOLN
INTRAMUSCULAR | Status: DC | PRN
  Administered 2023-08-31: .5 mg via INTRAVENOUS

## 2023-08-31 MED ORDER — FENTANYL CITRATE (PF) 100 MCG/2ML IJ SOLN
25.0000 ug | INTRAMUSCULAR | Status: DC | PRN
  Administered 2023-08-31 (×2): 50 ug via INTRAVENOUS

## 2023-08-31 MED ORDER — 0.9 % SODIUM CHLORIDE (POUR BTL) OPTIME
TOPICAL | Status: DC | PRN
  Administered 2023-08-31: 1000 mL

## 2023-08-31 MED ORDER — ACETAMINOPHEN 500 MG PO TABS: 500.0000 mg | ORAL_TABLET | Freq: Four times a day (QID) | ORAL | 0 refills | Status: DC | PRN

## 2023-08-31 MED ORDER — FENTANYL CITRATE (PF) 100 MCG/2ML IJ SOLN
INTRAMUSCULAR | Status: DC
Start: 2023-08-31 — End: 2023-08-31
  Filled 2023-08-31: qty 2

## 2023-08-31 MED ORDER — ONDANSETRON HCL 4 MG/2ML IJ SOLN
INTRAMUSCULAR | Status: AC
  Filled 2023-08-31: qty 2

## 2023-08-31 MED ORDER — MIDAZOLAM HCL 2 MG/2ML IJ SOLN
INTRAMUSCULAR | Status: AC
  Filled 2023-08-31: qty 2

## 2023-08-31 MED ORDER — AMISULPRIDE (ANTIEMETIC) 5 MG/2ML IV SOLN: 10.0000 mg | Freq: Once | INTRAVENOUS | Status: DC

## 2023-08-31 MED ORDER — AMISULPRIDE (ANTIEMETIC) 5 MG/2ML IV SOLN
INTRAVENOUS | Status: AC
  Administered 2023-08-31: 10 mg
  Filled 2023-08-31: qty 4

## 2023-08-31 MED ORDER — FENTANYL CITRATE (PF) 250 MCG/5ML IJ SOLN
INTRAMUSCULAR | Status: AC
  Filled 2023-08-31: qty 5

## 2023-08-31 MED ORDER — BUPIVACAINE HCL (PF) 0.25 % IJ SOLN
INTRAMUSCULAR | Status: AC
  Filled 2023-08-31: qty 30

## 2023-08-31 MED ORDER — OXYCODONE HCL 5 MG PO TABS
5.0000 mg | ORAL_TABLET | Freq: Once | ORAL | Status: AC | PRN
  Administered 2023-08-31: 5 mg via ORAL

## 2023-08-31 MED ORDER — GABAPENTIN 300 MG PO CAPS: 300.0000 mg | ORAL_CAPSULE | Freq: Two times a day (BID) | ORAL | 0 refills | Status: DC

## 2023-08-31 MED ORDER — ONDANSETRON HCL 4 MG/2ML IJ SOLN
INTRAMUSCULAR | Status: DC | PRN
Start: 2023-08-31 — End: 2023-08-31
  Administered 2023-08-31: 4 mg via INTRAVENOUS

## 2023-08-31 MED ORDER — POVIDONE-IODINE 10 % EX SWAB
2.0000 | Freq: Once | CUTANEOUS | Status: AC
  Administered 2023-08-31: 2 via TOPICAL

## 2023-08-31 MED ORDER — PROPOFOL 10 MG/ML IV BOLUS
INTRAVENOUS | Status: DC | PRN
  Administered 2023-08-31: 200 mg via INTRAVENOUS

## 2023-08-31 MED ORDER — LIDOCAINE 2% (20 MG/ML) 5 ML SYRINGE
INTRAMUSCULAR | Status: AC
  Filled 2023-08-31: qty 5

## 2023-08-31 MED ORDER — CHLORHEXIDINE GLUCONATE 0.12 % MT SOLN
15.0000 mL | Freq: Once | OROMUCOSAL | Status: AC
  Administered 2023-08-31: 15 mL via OROMUCOSAL
  Filled 2023-08-31: qty 15

## 2023-08-31 MED ORDER — LABETALOL HCL 5 MG/ML IV SOLN
INTRAVENOUS | Status: AC
  Filled 2023-08-31: qty 4

## 2023-08-31 MED ORDER — OXYCODONE HCL 5 MG/5ML PO SOLN: 5.0000 mg | Freq: Once | ORAL | Status: AC | PRN

## 2023-08-31 MED ORDER — LABETALOL HCL 5 MG/ML IV SOLN
INTRAVENOUS | Status: DC | PRN
  Administered 2023-08-31: 10 mg via INTRAVENOUS

## 2023-08-31 MED ORDER — HYDRALAZINE HCL 20 MG/ML IJ SOLN
INTRAMUSCULAR | Status: AC
  Filled 2023-08-31: qty 1

## 2023-08-31 MED ORDER — FENTANYL CITRATE (PF) 250 MCG/5ML IJ SOLN
INTRAMUSCULAR | Status: DC | PRN
  Administered 2023-08-31 (×2): 50 ug via INTRAVENOUS
  Administered 2023-08-31: 150 ug via INTRAVENOUS

## 2023-08-31 MED ORDER — PROPOFOL 10 MG/ML IV BOLUS
INTRAVENOUS | Status: AC
  Filled 2023-08-31: qty 20

## 2023-08-31 MED ORDER — LACTATED RINGERS IV SOLN: INTRAVENOUS | Status: DC

## 2023-08-31 MED ORDER — IBUPROFEN 800 MG PO TABS: 800.0000 mg | ORAL_TABLET | Freq: Three times a day (TID) | ORAL | 0 refills | Status: DC | PRN

## 2023-08-31 MED ORDER — HYDROMORPHONE HCL 1 MG/ML IJ SOLN
INTRAMUSCULAR | Status: AC
Start: 2023-08-31 — End: ?
  Filled 2023-08-31: qty 0.5

## 2023-08-31 MED ORDER — ACETAMINOPHEN 500 MG PO TABS
1000.0000 mg | ORAL_TABLET | ORAL | Status: AC
  Administered 2023-08-31: 1000 mg via ORAL
  Filled 2023-08-31: qty 2

## 2023-08-31 MED ORDER — BUPIVACAINE HCL (PF) 0.25 % IJ SOLN
INTRAMUSCULAR | Status: DC | PRN
  Administered 2023-08-31: 40 mL

## 2023-08-31 MED ORDER — KETOROLAC TROMETHAMINE 30 MG/ML IJ SOLN
INTRAMUSCULAR | Status: DC | PRN
  Administered 2023-08-31: 30 mg via INTRAVENOUS

## 2023-08-31 MED ORDER — LIDOCAINE 2% (20 MG/ML) 5 ML SYRINGE
INTRAMUSCULAR | Status: DC | PRN
  Administered 2023-08-31: 60 mg via INTRAVENOUS

## 2023-08-31 MED ORDER — OXYCODONE HCL 5 MG PO TABS: 5.0000 mg | ORAL_TABLET | ORAL | 0 refills | Status: DC | PRN

## 2023-08-31 MED ORDER — DEXAMETHASONE SODIUM PHOSPHATE 10 MG/ML IJ SOLN
INTRAMUSCULAR | Status: DC | PRN
  Administered 2023-08-31: 10 mg via INTRAVENOUS

## 2023-08-31 MED ORDER — MIDAZOLAM HCL 2 MG/2ML IJ SOLN
INTRAMUSCULAR | Status: DC | PRN
Start: 2023-08-31 — End: 2023-08-31
  Administered 2023-08-31: 2 mg via INTRAVENOUS

## 2023-08-31 MED ORDER — ORAL CARE MOUTH RINSE: 15.0000 mL | Freq: Once | OROMUCOSAL | Status: AC

## 2023-08-31 MED ORDER — SUGAMMADEX SODIUM 200 MG/2ML IV SOLN
INTRAVENOUS | Status: DC | PRN
  Administered 2023-08-31: 200 mg via INTRAVENOUS

## 2023-08-31 MED ORDER — ROCURONIUM BROMIDE 10 MG/ML (PF) SYRINGE
PREFILLED_SYRINGE | INTRAVENOUS | Status: DC | PRN
Start: 2023-08-31 — End: 2023-08-31
  Administered 2023-08-31: 20 mg via INTRAVENOUS
  Administered 2023-08-31: 80 mg via INTRAVENOUS

## 2023-08-31 MED ORDER — KETOROLAC TROMETHAMINE 30 MG/ML IJ SOLN
INTRAMUSCULAR | Status: AC
  Filled 2023-08-31: qty 1

## 2023-08-31 MED ORDER — CEFAZOLIN SODIUM-DEXTROSE 2-4 GM/100ML-% IV SOLN
2.0000 g | Freq: Once | INTRAVENOUS | Status: AC
Start: 2023-08-31 — End: 2023-08-31
  Administered 2023-08-31: 2 g via INTRAVENOUS
  Filled 2023-08-31: qty 100

## 2023-08-31 MED ORDER — BUPIVACAINE HCL (PF) 0.25 % IJ SOLN
INTRAMUSCULAR | Status: AC
  Filled 2023-08-31: qty 10

## 2023-08-31 MED ORDER — HYDRALAZINE HCL 20 MG/ML IJ SOLN
INTRAMUSCULAR | Status: DC | PRN
Start: 2023-08-31 — End: 2023-08-31
  Administered 2023-08-31: 12 mg via INTRAVENOUS

## 2023-08-31 MED ORDER — OXYCODONE HCL 5 MG PO TABS
ORAL_TABLET | ORAL | Status: AC
  Filled 2023-08-31: qty 1

## 2023-08-31 SURGICAL SUPPLY — 43 items
APPLICATOR ARISTA FLEXITIP XL (MISCELLANEOUS) IMPLANT
CHLORAPREP W/TINT 26 (MISCELLANEOUS) ×2 IMPLANT
COVER MAYO STAND STRL (DRAPES) ×2 IMPLANT
DEFOGGER SCOPE WARMER CLEARIFY (MISCELLANEOUS) ×2 IMPLANT
DERMABOND ADVANCED .7 DNX12 (GAUZE/BANDAGES/DRESSINGS) ×2 IMPLANT
DRAPE SURG IRRIG POUCH 19X23 (DRAPES) ×2 IMPLANT
GLOVE BIO SURGEON STRL SZ 6 (GLOVE) ×4 IMPLANT
GLOVE BIOGEL PI IND STRL 7.0 (GLOVE) ×4 IMPLANT
GOWN STRL REUS W/ TWL LRG LVL3 (GOWN DISPOSABLE) ×6 IMPLANT
HEMOSTAT ARISTA ABSORB 3G PWDR (HEMOSTASIS) IMPLANT
HIBICLENS CHG 4% 4OZ BTL (MISCELLANEOUS) ×4 IMPLANT
IRRIGATION SUCT STRKRFLW 2 WTP (MISCELLANEOUS) ×2 IMPLANT
KIT PINK PAD W/HEAD ARE REST (MISCELLANEOUS) ×2 IMPLANT
KIT PINK PAD W/HEAD ARM REST (MISCELLANEOUS) ×2 IMPLANT
KIT TURNOVER KIT B (KITS) ×2 IMPLANT
LHOOK LAP DISP 36CM (ELECTROSURGICAL) ×2 IMPLANT
MANIPULATOR ADVINCU DEL 2.5 PL (MISCELLANEOUS) IMPLANT
MANIPULATOR ADVINCU DEL 3.0 PL (MISCELLANEOUS) IMPLANT
MANIPULATOR ADVINCU DEL 3.5 PL (MISCELLANEOUS) IMPLANT
MANIPULATOR ADVINCU DEL 4.0 PL (MISCELLANEOUS) IMPLANT
NDL INSUFFLATION 14GA 120MM (NEEDLE) ×2 IMPLANT
NEEDLE INSUFFLATION 14GA 120MM (NEEDLE) ×2 IMPLANT
NS IRRIG 1000ML POUR BTL (IV SOLUTION) ×2 IMPLANT
OCCLUDER COLPOPNEUMO (BALLOONS) ×2 IMPLANT
PACK LAPAROSCOPY BASIN (CUSTOM PROCEDURE TRAY) ×2 IMPLANT
PAD OB MATERNITY 11 LF (PERSONAL CARE ITEMS) ×2 IMPLANT
PENCIL SMOKE EVACUATOR (MISCELLANEOUS) ×2 IMPLANT
POUCH LAPAROSCOPIC INSTRUMENT (MISCELLANEOUS) ×2 IMPLANT
SEALER TISSUE G2 CVD JAW 35 (ENDOMECHANICALS) IMPLANT
SET CYSTO W/LG BORE CLAMP LF (SET/KITS/TRAYS/PACK) ×2 IMPLANT
SET TUBE SMOKE EVAC HIGH FLOW (TUBING) ×2 IMPLANT
SPIKE FLUID TRANSFER (MISCELLANEOUS) ×2 IMPLANT
SUT VICRYL 4-0 PS2 18IN ABS (SUTURE) ×2 IMPLANT
SUT VLOC 180 0 9IN GS21 (SUTURE) ×2 IMPLANT
SYR 10ML LL (SYRINGE) ×2 IMPLANT
SYR 30ML LL (SYRINGE) IMPLANT
SYR 50ML LL SCALE MARK (SYRINGE) ×2 IMPLANT
TOWEL GREEN STERILE FF (TOWEL DISPOSABLE) ×4 IMPLANT
TRAY FOLEY W/BAG SLVR 14FR (SET/KITS/TRAYS/PACK) ×2 IMPLANT
TROCAR ADV FIXATION 5X100MM (TROCAR) ×4 IMPLANT
TROCAR KII 8X100ML NONTHREADED (TROCAR) ×2 IMPLANT
TROCAR XCEL NON-BLD 5MMX100MML (ENDOMECHANICALS) ×2 IMPLANT
UNDERPAD 30X36 HEAVY ABSORB (UNDERPADS AND DIAPERS) ×2 IMPLANT

## 2023-08-31 NOTE — Op Note (Signed)
 Holly Washington PROCEDURE DATE: 08/31/2023  PREOPERATIVE DIAGNOSIS: Post-ablation tubal syndrome POSTOPERATIVE DIAGNOSIS: The same PROCEDURE: Total laparoscopic hysterectomy, bilateral salpingectomy, cystoscopy, TAP block SURGEON:  Dr. [name] ASSISTANT:  Dr. Raynell Caller.  An experienced assistant was required given the standard of surgical care given the complexity of the case.  This assistant was needed for exposure, dissection, suctioning, retraction, instrument exchange, and for overall help during the procedure.  INDICATIONS: 43 y.o. [id number]  here for definitive surgical management secondary to the indications listed under preoperative diagnoses; please see preoperative note for further details.  Risks of surgery were discussed with the patient including but not limited to: bleeding which may require transfusion or reoperation; infection which may require antibiotics; injury to bowel, bladder, ureters or other surrounding organs; need for additional procedures; thromboembolic phenomenon, incisional problems and other postoperative/anesthesia complications. Written informed consent was obtained.    FINDINGS:  NEFG, normal appearing cervix. Uterus sounded to 8 cm. Small amount of omental adhesions to anterior abdominal wall in the suprapubic area. Filshie clips were in the posterior cul de sac attached to each other but not attached to any surrounding tissue. Small amount of possible endometriosis on the right tube. Normal appearing ovaries. Normal appearing  bladder at the end of the surgery. Brisk ureteral jets bilaterally at the end of the procedure.   ANESTHESIA:    General INTRAVENOUS FLUIDS: 900  ml ESTIMATED BLOOD LOSS: 150 ml URINE OUTPUT: 400 ml   SPECIMENS: Uterus, cervix, bilateral fallopian tubes, filshie clips COMPLICATIONS: None immediate  PROCEDURE IN DETAIL:  The patient received intravenous antibiotics and had sequential compression devices applied to her lower  extremities while in the preoperative area.  She was then taken to the operating room where general anesthesia was administered and was found to be adequate.  She was placed in the dorsal lithotomy position, and was prepped and draped in a sterile manner.    After an adequate timeout was performed, an avincula uterine manipulator was placed at this time.  A Foley catheter was inserted into her bladder and attached to constant drainage.   Attention turned to patient's abdomen. Skin incision made with the scalpel in the umbilicus and closed technique used for entry using the veress needle. Opening pressure was 5.  Pneumoperitoneum achieved to a pressure of 15.  0 degree laparoscope was used with the 5mm optiview port and trocar to enter the cavity under direct visualization. Inspection below the point of entry revealed no evidence of bowel injury. Patient placed in steep trendelenburg.    I placed a transversus abdominus plane block using laparoscopic guidance at T10 and T7 bilaterally, at the junction of the rectus anterior and band of oblique muscles.  10 cc of 0.25% bupivacaine  at each site, 40 cc total = 100 mg of bupivacaine .  Doyle's bubble technique was used. This was placed for post operative pain management.  Lateral 5 mm ports were then placed. The omental adhesions were then removed using the Enseal. The suprapubic 8mm port was then placed.   The pelvis was then carefully examined.  Attention was turned to the fallopian tubes; these were freed from the underlying mesosalpinx and the uterine attachments using the Enseal device.  The bilateral round and broad ligaments were then clamped and transected with the Enseal device.  The uterine artery was then skeletonized and a bladder flap was created.  The ureters were noted to be safely away from the area of dissection.  The bladder was then bluntly dissected  off the lower uterine segment.  At this point, attention was turned to the uterine vessels,  which were clamped and ligated using the Enseal.  Good hemostasis was noted overall.  The uterosacral and cardinal ligaments were clamped, cut and ligated bilaterally.  Attention was then turned to the cervicovaginal junction, and the bovie hook was used to transect the cervix from the surrounding vagina using the ring of the avincula as a guide. On the right side there was some bleeding from the uterine artery before complete separation was completed - it was cauterized with the Enseal. The remainder of the colpotomy was done circumferentially allowing total hysterectomy.  The uterus was then removed from the vagina and the vaginal cuff incision was then closed with 0 Vlock.  Overall excellent hemostasis was noted.  The ureters were reexamined bilaterally and were pulsating normally. The abdominal pressure was reduced and hemostasis was confirmed. Arista was applied and no bleeding noted once again.      Cystoscopy was then perfomed and showed bilateral ureteral jets.  No stitches were visualized in the bladder during cystoscopy and there was no bladder injury.    All trocars were removed, and the abdomen was desufflated. All skin incisions were closed with 4-0 Vicryl subcuticular stitches and Dermabond. The patient tolerated the procedures well.    All instruments, needles, and sponge counts were correct x 2. The patient was taken to the recovery room awake, extubated and in stable condition.   [name], MD, FACOG Obstetrician & Gynecologist, [facility] for [facility], [facility]

## 2023-08-31 NOTE — Discharge Instructions (Signed)
     No ibuprofen , Advil , Aleve, Motrin , ketorolac , meloxicam, naproxen, or other NSAIDS until after 4:00 pm today if needed.  No acetaminophen /Tylenol  until after 2:00 pm today if needed.     Post Anesthesia Home Care Instructions  Activity: Get plenty of rest for the remainder of the day. A responsible individual must stay with you for 24 hours following the procedure.  For the next 24 hours, DO NOT: -Drive a car -Advertising copywriter -Drink alcoholic beverages -Take any medication unless instructed by your physician -Make any legal decisions or sign important papers.  Meals: Start with liquid foods such as gelatin or soup. Progress to regular foods as tolerated. Avoid greasy, spicy, heavy foods. If nausea and/or vomiting occur, drink only clear liquids until the nausea and/or vomiting subsides. Call your physician if vomiting continues.  Special Instructions/Symptoms: Your throat may feel dry or sore from the anesthesia or the breathing tube placed in your throat during surgery. If this causes discomfort, gargle with warm salt water. The discomfort should disappear within 24 hours.

## 2023-08-31 NOTE — Interval H&P Note (Signed)
 History and Physical Interval Note:  08/31/2023 8:37 AM  Holly Washington  has presented today for surgery, with the diagnosis of Pelvic pain.  The various methods of treatment have been discussed with the patient and family. After consideration of risks, benefits and other options for treatment, the patient has consented to  Procedure(s) with comments: HYSTERECTOMY, TOTAL, LAPAROSCOPIC, WITH SALPINGECTOMY (Bilateral) - bilateral salpingectomy w/ TAP BLOCK CYSTOSCOPY (N/A) as a surgical intervention.  The patient's history has been reviewed, patient examined, no change in status, stable for surgery.  I have reviewed the patient's chart and labs.  Questions were answered to the patient's satisfaction.     [name]

## 2023-08-31 NOTE — Transfer of Care (Signed)
 Immediate Anesthesia Transfer of Care Note  Patient: Holly Washington  Procedure(s) Performed: TOTAL LAPAROSCOPIC HYSTERECTOMY, BILATERAL SALPINGECTOMY (Bilateral: Abdomen) CYSTOSCOPY (Bladder)  Patient Location: PACU  Anesthesia Type:General  Level of Consciousness: awake, alert , oriented, and patient cooperative  Airway & Oxygen Therapy: Patient Spontanous Breathing and Patient connected to nasal cannula oxygen  Post-op Assessment: Report given to RN and Post -op Vital signs reviewed and stable  Post vital signs: Reviewed and stable  Last Vitals:  Vitals Value Taken Time  BP 127/78 08/31/23 1132  Temp    Pulse 73 08/31/23 1133  Resp    SpO2 99 % 08/31/23 1133  Vitals shown include unfiled device data.  Last Pain:  Vitals:   08/31/23 0757  PainSc: 0-No pain      Patients Stated Pain Goal: 0 (08/31/23 0757)  Complications: No notable events documented.

## 2023-08-31 NOTE — Anesthesia Preprocedure Evaluation (Signed)
 Anesthesia Evaluation  Patient identified by MRN, date of birth, ID band Patient awake    Reviewed: Allergy & Precautions, NPO status , Patient's Chart, lab work & pertinent test results  History of Anesthesia Complications Negative for: history of anesthetic complications  Airway Mallampati: I  TM Distance: >3 FB Neck ROM: Full    Dental  (+) Teeth Intact, Dental Advisory Given   Pulmonary neg shortness of breath, neg sleep apnea, neg COPD, neg recent URI   breath sounds clear to auscultation       Cardiovascular negative cardio ROS  Rhythm:Regular     Neuro/Psych  Headaches  Anxiety        GI/Hepatic negative GI ROS, Neg liver ROS,,,  Endo/Other  negative endocrine ROS    Renal/GU negative Renal ROS     Musculoskeletal negative musculoskeletal ROS (+)    Abdominal   Peds  Hematology negative hematology ROS (+)   Anesthesia Other Findings   Reproductive/Obstetrics Lab Results      Component                Value               Date                      PREGTESTUR               NEGATIVE            08/31/2023                                        Anesthesia Physical Anesthesia Plan  ASA: 2  Anesthesia Plan: General   Post-op Pain Management: Ofirmev  IV (intra-op)* and Toradol  IV (intra-op)*   Induction: Intravenous  PONV Risk Score and Plan: 4 or greater and Ondansetron , Dexamethasone  and Midazolam   Airway Management Planned: Oral ETT  Additional Equipment: None  Intra-op Plan:   Post-operative Plan: Extubation in OR  Informed Consent: I have reviewed the patients History and Physical, chart, labs and discussed the procedure including the risks, benefits and alternatives for the proposed anesthesia with the patient or authorized representative who has indicated his/her understanding and acceptance.     Dental advisory given  Plan Discussed with: CRNA  Anesthesia Plan  Comments:        Anesthesia Quick Evaluation

## 2023-08-31 NOTE — Anesthesia Procedure Notes (Signed)
 Procedure Name: Intubation Date/Time: 08/31/2023 9:17 AM  Performed by: Ballard Levels, CRNAPre-anesthesia Checklist: Patient identified, Emergency Drugs available, Suction available and Patient being monitored Patient Re-evaluated:Patient Re-evaluated prior to induction Oxygen Delivery Method: Circle System Utilized Preoxygenation: Pre-oxygenation with 100% oxygen Induction Type: IV induction Ventilation: Mask ventilation without difficulty Laryngoscope Size: Miller and 3 Grade View: Grade II Tube type: Oral Tube size: 7.0 mm Number of attempts: 1 Airway Equipment and Method: Stylet Placement Confirmation: ETT inserted through vocal cords under direct vision, positive ETCO2 and breath sounds checked- equal and bilateral Secured at: 21 cm Tube secured with: Tape Dental Injury: Teeth and Oropharynx as per pre-operative assessment

## 2023-09-01 ENCOUNTER — Encounter (HOSPITAL_COMMUNITY): Payer: Self-pay | Admitting: Obstetrics and Gynecology

## 2023-09-01 LAB — SURGICAL PATHOLOGY

## 2023-09-01 LAB — ABO/RH: ABO/RH(D): O POS

## 2023-09-01 NOTE — Anesthesia Postprocedure Evaluation (Signed)
 Anesthesia Post Note  Patient: Holly Washington  Procedure(s) Performed: TOTAL LAPAROSCOPIC HYSTERECTOMY, BILATERAL SALPINGECTOMY (Bilateral: Abdomen) CYSTOSCOPY (Bladder)     Patient location during evaluation: PACU Anesthesia Type: General Level of consciousness: awake and alert Pain management: pain level controlled Vital Signs Assessment: post-procedure vital signs reviewed and stable Respiratory status: spontaneous breathing, nonlabored ventilation and respiratory function stable Cardiovascular status: blood pressure returned to baseline and stable Postop Assessment: no apparent nausea or vomiting Anesthetic complications: no   No notable events documented.                Lowanda Cashaw

## 2023-09-02 ENCOUNTER — Encounter: Payer: Self-pay | Admitting: Obstetrics and Gynecology

## 2023-09-03 ENCOUNTER — Encounter: Payer: Self-pay | Admitting: Obstetrics and Gynecology

## 2023-09-08 ENCOUNTER — Encounter: Payer: Self-pay | Admitting: Obstetrics and Gynecology

## 2023-09-23 ENCOUNTER — Encounter: Payer: Self-pay | Admitting: Obstetrics and Gynecology

## 2023-09-23 ENCOUNTER — Ambulatory Visit: Admitting: Obstetrics and Gynecology

## 2023-09-23 VITALS — BP 146/90 | HR 80 | Ht 67.0 in | Wt 222.0 lb

## 2023-09-23 DIAGNOSIS — Z09 Encounter for follow-up examination after completed treatment for conditions other than malignant neoplasm: Secondary | ICD-10-CM

## 2023-09-23 NOTE — Progress Notes (Signed)
   GYNECOLOGY OFFICE VISIT NOTE  History:  Holly Washington is a 43 y.o. [phone] here today for postop check. She has general aches and pains and sometimes pain on the right side, but she has had no pain like the pain she had prior to surgery. She worked from home the following week and has otherwise gone back to work and is doing well.   Sometimes pain in her bladder when very full, but no burning with urination. Normal bladder and bowel function otherwise.    Has vacation in [place]  scheduled around the 12 week mark.   The following portions of the patient's history were reviewed and updated as appropriate: allergies, current medications, past family history, past medical history, past social history, past surgical history and problem list.   Health Maintenance:   Diagnosis  Date Value Ref Range Status  03/15/2023   Final   - Negative for intraepithelial lesion or malignancy (NILM)    Normal mammogram on 03/19/2023.   Review of Systems:  Pertinent items noted in HPI and remainder of comprehensive ROS otherwise negative.  Physical Exam:  BP (!) 146/90   Pulse 80   Ht 5\' 7"  (1.702 m)   Wt 222 lb (100.7 kg)   LMP 12/10/2022 (Approximate)   BMI 34.77 kg/m  CONSTITUTIONAL: Well-developed, well-nourished female in no acute distress.  HEENT:  Normocephalic, atraumatic. External right and left ear normal. No scleral icterus.  NECK: Normal range of motion, supple, no masses noted on observation SKIN: No rash noted. Not diaphoretic. No erythema. No pallor. MUSCULOSKELETAL: Normal range of motion. No edema noted. NEUROLOGIC: Alert and oriented to person, place, and time. Normal muscle tone coordination. No cranial nerve deficit noted. PSYCHIATRIC: Normal mood and affect. Normal behavior. Normal judgment and thought content.  ABDOMEN: No masses noted. No other overt distention noted.  Inciisions c/d/I x4  PELVIC: Deferred  Labs and Imaging No results found for this or any previous  visit (from the past week). No results found.  Assessment and Plan:  1. Postop check (Primary) Healing well.    Routine preventative health maintenance measures emphasized. Please refer to After Visit Summary for other counseling recommendations.   Return in about 8 weeks (around 11/18/2023) for post-op check up.  [name], MD, FACOG Obstetrician & Gynecologist, [facility] for [facility], [facility]

## 2023-10-07 ENCOUNTER — Encounter: Admitting: Obstetrics and Gynecology

## 2023-10-27 ENCOUNTER — Encounter: Payer: Self-pay | Admitting: Obstetrics and Gynecology

## 2023-11-15 NOTE — Progress Notes (Unsigned)
   GYNECOLOGY OFFICE VISIT NOTE  History:  Holly Washington is a 43 y.o. [phone] here today for postop check s/p TLH/salpingectomy and cystoscopy.   Since about one week after postop check, she notes severe anxiety. Since that time and her message it has slowly improved but still not her usual. She has no tried any medication. She had blood work through her PCP at [facility]. I reviewed the FSH, E2 and testosterone and they were all normal. She feels like the worse part of  her mood is the anxiety (also has some mood swings and irritability). She has felt easily stressed.  Notes it is worse in the morning.    She has not had pain since her surgery and she is thankful that that has resolved.   The following portions of the patient's history were reviewed and updated as appropriate: allergies, current medications, past family history, past medical history, past social history, past surgical history and problem list.   Health Maintenance:   Normal pap and negative HRHPV: Diagnosis  Date Value Ref Range Status  03/15/2023   Final   - Negative for intraepithelial lesion or malignancy (NILM)     Normal mammogram on 03/2023.   Review of Systems:  Pertinent items noted in HPI and remainder of comprehensive ROS otherwise negative.  Physical Exam:  BP 138/89   Pulse 90   LMP 12/10/2022 (Approximate)  CONSTITUTIONAL: Well-developed, well-nourished female in no acute distress.  HEENT:  Normocephalic, atraumatic. External right and left ear normal. No scleral icterus.  NECK: Normal range of motion, supple, no masses noted on observation SKIN: No rash noted. Not diaphoretic. No erythema. No pallor. MUSCULOSKELETAL: Normal range of motion. No edema noted. NEUROLOGIC: Alert and oriented to person, place, and time. Normal muscle tone coordination. No cranial nerve deficit noted. PSYCHIATRIC: Normal mood and affect. Normal behavior. Normal judgment and thought content.  PELVIC: Normal appearing  external genitalia; normal urethral meatus; normal appearing vaginal mucosa. Cuff well healed.  Performed in the presence of a chaperone  Labs and Imaging No results found for this or any previous visit (from the past week). No results found.  Assessment and Plan:  1. Postop check (Primary) No restrictions.   2. Anxiety Discussed option for buspar  for GAD. Reviewed initial dosing and ability to taper. Reviewed non-addictive. Reviewed expectations for symptom control  Labs done by PCP and FSH/E2 and testosterone normal.    Meds ordered this encounter  Medications   busPIRone  (BUSPAR ) 5 MG tablet    Sig: Take 1 tablet (5 mg total) by mouth 3 (three) times daily.    Dispense:  90 tablet    Refill:  3     Routine preventative health maintenance measures emphasized. Please refer to After Visit Summary for other counseling recommendations.   No follow-ups on file.  Vina Solian, MD, FACOG Obstetrician & Gynecologist, [facility] for [facility], [facility]

## 2023-11-18 ENCOUNTER — Ambulatory Visit: Admitting: Obstetrics and Gynecology

## 2023-11-18 ENCOUNTER — Encounter: Payer: Self-pay | Admitting: Obstetrics and Gynecology

## 2023-11-18 VITALS — BP 138/89 | HR 90

## 2023-11-18 DIAGNOSIS — N951 Menopausal and female climacteric states: Secondary | ICD-10-CM

## 2023-11-18 DIAGNOSIS — F419 Anxiety disorder, unspecified: Secondary | ICD-10-CM

## 2023-11-18 DIAGNOSIS — Z09 Encounter for follow-up examination after completed treatment for conditions other than malignant neoplasm: Secondary | ICD-10-CM

## 2023-11-18 MED ORDER — BUSPIRONE HCL 5 MG PO TABS: 5.0000 mg | ORAL_TABLET | Freq: Three times a day (TID) | ORAL | 3 refills | Status: AC

## 2024-02-01 ENCOUNTER — Other Ambulatory Visit: Payer: Self-pay | Admitting: Obstetrics & Gynecology

## 2024-02-01 DIAGNOSIS — Z1231 Encounter for screening mammogram for malignant neoplasm of breast: Secondary | ICD-10-CM

## 2024-03-27 ENCOUNTER — Ambulatory Visit (HOSPITAL_BASED_OUTPATIENT_CLINIC_OR_DEPARTMENT_OTHER)

## 2024-04-10 ENCOUNTER — Encounter (HOSPITAL_BASED_OUTPATIENT_CLINIC_OR_DEPARTMENT_OTHER): Payer: Self-pay

## 2024-04-10 ENCOUNTER — Ambulatory Visit (HOSPITAL_BASED_OUTPATIENT_CLINIC_OR_DEPARTMENT_OTHER)
Admission: RE | Admit: 2024-04-10 | Discharge: 2024-04-10 | Disposition: A | Source: Ambulatory Visit | Attending: Obstetrics & Gynecology | Admitting: Obstetrics & Gynecology

## 2024-04-10 DIAGNOSIS — Z1231 Encounter for screening mammogram for malignant neoplasm of breast: Secondary | ICD-10-CM | POA: Diagnosis present

## 2024-04-14 ENCOUNTER — Ambulatory Visit: Payer: Self-pay | Admitting: Obstetrics & Gynecology
